# Patient Record
Sex: Male | Born: 1950 | Race: White | Hispanic: No | Marital: Married | Smoking: Never smoker
Health system: Southern US, Community
[De-identification: ages and names within clinical notes are randomized; demographics above are authoritative.]

## PROBLEM LIST (undated history)

## (undated) DIAGNOSIS — I2510 Atherosclerotic heart disease of native coronary artery without angina pectoris: Secondary | ICD-10-CM

## (undated) DIAGNOSIS — I219 Acute myocardial infarction, unspecified: Secondary | ICD-10-CM

## (undated) DIAGNOSIS — N289 Disorder of kidney and ureter, unspecified: Secondary | ICD-10-CM

## (undated) DIAGNOSIS — I509 Heart failure, unspecified: Secondary | ICD-10-CM

## (undated) DIAGNOSIS — I639 Cerebral infarction, unspecified: Secondary | ICD-10-CM

## (undated) DIAGNOSIS — E119 Type 2 diabetes mellitus without complications: Secondary | ICD-10-CM

## (undated) DIAGNOSIS — J8410 Pulmonary fibrosis, unspecified: Secondary | ICD-10-CM

## (undated) DIAGNOSIS — I10 Essential (primary) hypertension: Secondary | ICD-10-CM

## (undated) DIAGNOSIS — M1990 Unspecified osteoarthritis, unspecified site: Secondary | ICD-10-CM

## (undated) DIAGNOSIS — R911 Solitary pulmonary nodule: Secondary | ICD-10-CM

## (undated) HISTORY — PX: EP IMPLANTABLE DEVICE: SHX172B

## (undated) HISTORY — PX: BACK SURGERY: SHX140

## (undated) HISTORY — PX: CARDIAC SURGERY: SHX584

## (undated) HISTORY — PX: OTHER SURGICAL HISTORY: SHX169

## (undated) HISTORY — PX: SHOULDER ARTHROSCOPY: SHX128

## (undated) HISTORY — PX: CORONARY ARTERY BYPASS GRAFT: SHX141

---

## 2011-11-26 DIAGNOSIS — C4490 Unspecified malignant neoplasm of skin, unspecified: Secondary | ICD-10-CM | POA: Insufficient documentation

## 2012-06-15 DIAGNOSIS — I272 Pulmonary hypertension, unspecified: Secondary | ICD-10-CM | POA: Diagnosis present

## 2012-07-22 DIAGNOSIS — E119 Type 2 diabetes mellitus without complications: Secondary | ICD-10-CM

## 2012-07-22 DIAGNOSIS — Z951 Presence of aortocoronary bypass graft: Secondary | ICD-10-CM | POA: Insufficient documentation

## 2012-07-22 DIAGNOSIS — J8410 Pulmonary fibrosis, unspecified: Secondary | ICD-10-CM | POA: Diagnosis present

## 2013-01-20 DIAGNOSIS — I252 Old myocardial infarction: Secondary | ICD-10-CM | POA: Insufficient documentation

## 2013-03-14 DIAGNOSIS — I255 Ischemic cardiomyopathy: Secondary | ICD-10-CM | POA: Diagnosis present

## 2013-03-22 DIAGNOSIS — J9690 Respiratory failure, unspecified, unspecified whether with hypoxia or hypercapnia: Secondary | ICD-10-CM | POA: Insufficient documentation

## 2013-04-30 DIAGNOSIS — R52 Pain, unspecified: Secondary | ICD-10-CM | POA: Insufficient documentation

## 2013-04-30 DIAGNOSIS — N138 Other obstructive and reflux uropathy: Secondary | ICD-10-CM | POA: Insufficient documentation

## 2013-04-30 DIAGNOSIS — N401 Enlarged prostate with lower urinary tract symptoms: Secondary | ICD-10-CM | POA: Insufficient documentation

## 2013-04-30 DIAGNOSIS — I2510 Atherosclerotic heart disease of native coronary artery without angina pectoris: Secondary | ICD-10-CM | POA: Diagnosis present

## 2013-05-03 DIAGNOSIS — R918 Other nonspecific abnormal finding of lung field: Secondary | ICD-10-CM | POA: Insufficient documentation

## 2013-07-24 DIAGNOSIS — I509 Heart failure, unspecified: Secondary | ICD-10-CM

## 2013-08-18 DIAGNOSIS — G4733 Obstructive sleep apnea (adult) (pediatric): Secondary | ICD-10-CM | POA: Insufficient documentation

## 2013-09-17 ENCOUNTER — Emergency Department: Payer: Self-pay | Admitting: Emergency Medicine

## 2013-09-17 LAB — COMPREHENSIVE METABOLIC PANEL WITH GFR
Albumin: 3.5 g/dL
Alkaline Phosphatase: 116 U/L
Anion Gap: 8
BUN: 46 mg/dL — ABNORMAL HIGH
Bilirubin,Total: 0.3 mg/dL
Calcium, Total: 8.4 mg/dL — ABNORMAL LOW
Chloride: 106 mmol/L
Co2: 27 mmol/L
Creatinine: 2.27 mg/dL — ABNORMAL HIGH
EGFR (African American): 34 — ABNORMAL LOW
EGFR (Non-African Amer.): 30 — ABNORMAL LOW
Glucose: 142 mg/dL — ABNORMAL HIGH
Osmolality: 296
Potassium: 3.5 mmol/L
SGOT(AST): 45 U/L — ABNORMAL HIGH
SGPT (ALT): 48 U/L
Sodium: 141 mmol/L
Total Protein: 7 g/dL

## 2013-09-17 LAB — URINALYSIS, COMPLETE
BACTERIA: NONE SEEN
BILIRUBIN, UR: NEGATIVE
Blood: NEGATIVE
GLUCOSE, UR: NEGATIVE mg/dL (ref 0–75)
Ketone: NEGATIVE
Leukocyte Esterase: NEGATIVE
Nitrite: NEGATIVE
PH: 5 (ref 4.5–8.0)
Protein: NEGATIVE
Specific Gravity: 1.008 (ref 1.003–1.030)
Squamous Epithelial: 1
WBC UR: <1 /HPF (ref 0–5)

## 2013-09-17 LAB — CBC
HCT: 34.3 % — AB (ref 40.0–52.0)
HGB: 11.2 g/dL — ABNORMAL LOW (ref 13.0–18.0)
MCH: 31.8 pg (ref 26.0–34.0)
MCHC: 32.7 g/dL (ref 32.0–36.0)
MCV: 97 fL (ref 80–100)
Platelet: 215 10*3/uL (ref 150–440)
RBC: 3.52 10*6/uL — AB (ref 4.40–5.90)
RDW: 15.1 % — ABNORMAL HIGH (ref 11.5–14.5)
WBC: 7.3 10*3/uL (ref 3.8–10.6)

## 2013-09-17 LAB — TROPONIN I

## 2013-10-02 ENCOUNTER — Other Ambulatory Visit: Payer: Self-pay | Admitting: Cardiology

## 2013-10-02 LAB — CBC WITH DIFFERENTIAL/PLATELET
Basophil #: 0.1 10*3/uL (ref 0.0–0.1)
Basophil %: 1.2 %
EOS ABS: 0.7 10*3/uL (ref 0.0–0.7)
Eosinophil %: 7.5 %
HCT: 35.8 % — AB (ref 40.0–52.0)
HGB: 11.6 g/dL — ABNORMAL LOW (ref 13.0–18.0)
Lymphocyte #: 1.4 10*3/uL (ref 1.0–3.6)
Lymphocyte %: 15.7 %
MCH: 31.7 pg (ref 26.0–34.0)
MCHC: 32.3 g/dL (ref 32.0–36.0)
MCV: 98 fL (ref 80–100)
Monocyte #: 0.9 x10 3/mm (ref 0.2–1.0)
Monocyte %: 10.5 %
Neutrophil #: 5.8 10*3/uL (ref 1.4–6.5)
Neutrophil %: 65.1 %
Platelet: 219 10*3/uL (ref 150–440)
RBC: 3.64 10*6/uL — ABNORMAL LOW (ref 4.40–5.90)
RDW: 15.3 % — ABNORMAL HIGH (ref 11.5–14.5)
WBC: 8.8 10*3/uL (ref 3.8–10.6)

## 2013-10-02 LAB — BASIC METABOLIC PANEL
ANION GAP: 6 — AB (ref 7–16)
BUN: 50 mg/dL — ABNORMAL HIGH (ref 7–18)
Calcium, Total: 9.1 mg/dL (ref 8.5–10.1)
Chloride: 101 mmol/L (ref 98–107)
Co2: 30 mmol/L (ref 21–32)
Creatinine: 2.57 mg/dL — ABNORMAL HIGH (ref 0.60–1.30)
EGFR (Non-African Amer.): 25 — ABNORMAL LOW
GFR CALC AF AMER: 30 — AB
GLUCOSE: 167 mg/dL — AB (ref 65–99)
Osmolality: 291 (ref 275–301)
POTASSIUM: 4 mmol/L (ref 3.5–5.1)
Sodium: 137 mmol/L (ref 136–145)

## 2013-10-11 DIAGNOSIS — Z95810 Presence of automatic (implantable) cardiac defibrillator: Secondary | ICD-10-CM | POA: Diagnosis present

## 2013-11-02 DIAGNOSIS — I495 Sick sinus syndrome: Secondary | ICD-10-CM | POA: Insufficient documentation

## 2013-11-12 HISTORY — PX: CAROTID STENT: SHX1301

## 2013-11-20 DIAGNOSIS — I10 Essential (primary) hypertension: Secondary | ICD-10-CM | POA: Insufficient documentation

## 2013-11-22 ENCOUNTER — Inpatient Hospital Stay (HOSPITAL_COMMUNITY): Payer: BC Managed Care – PPO

## 2013-11-22 ENCOUNTER — Encounter (HOSPITAL_COMMUNITY): Payer: Self-pay | Admitting: Emergency Medicine

## 2013-11-22 ENCOUNTER — Emergency Department (HOSPITAL_COMMUNITY): Payer: BC Managed Care – PPO

## 2013-11-22 ENCOUNTER — Inpatient Hospital Stay (HOSPITAL_COMMUNITY)
Admission: EM | Admit: 2013-11-22 | Discharge: 2013-11-23 | DRG: 069 | Disposition: A | Discharge status: Other Acute Inpatient Hospital | Payer: BC Managed Care – PPO | Attending: Family Medicine | Admitting: Family Medicine

## 2013-11-22 DIAGNOSIS — N390 Urinary tract infection, site not specified: Secondary | ICD-10-CM | POA: Diagnosis present

## 2013-11-22 DIAGNOSIS — I959 Hypotension, unspecified: Secondary | ICD-10-CM | POA: Diagnosis present

## 2013-11-22 DIAGNOSIS — I639 Cerebral infarction, unspecified: Secondary | ICD-10-CM | POA: Diagnosis present

## 2013-11-22 DIAGNOSIS — Z8673 Personal history of transient ischemic attack (TIA), and cerebral infarction without residual deficits: Secondary | ICD-10-CM | POA: Diagnosis not present

## 2013-11-22 DIAGNOSIS — Z79899 Other long term (current) drug therapy: Secondary | ICD-10-CM | POA: Diagnosis not present

## 2013-11-22 DIAGNOSIS — N189 Chronic kidney disease, unspecified: Secondary | ICD-10-CM | POA: Diagnosis present

## 2013-11-22 DIAGNOSIS — M109 Gout, unspecified: Secondary | ICD-10-CM | POA: Insufficient documentation

## 2013-11-22 DIAGNOSIS — Z7902 Long term (current) use of antithrombotics/antiplatelets: Secondary | ICD-10-CM | POA: Diagnosis not present

## 2013-11-22 DIAGNOSIS — J8410 Pulmonary fibrosis, unspecified: Secondary | ICD-10-CM

## 2013-11-22 DIAGNOSIS — R531 Weakness: Secondary | ICD-10-CM | POA: Diagnosis not present

## 2013-11-22 DIAGNOSIS — G451 Carotid artery syndrome (hemispheric): Secondary | ICD-10-CM

## 2013-11-22 DIAGNOSIS — I2510 Atherosclerotic heart disease of native coronary artery without angina pectoris: Secondary | ICD-10-CM | POA: Diagnosis present

## 2013-11-22 DIAGNOSIS — G459 Transient cerebral ischemic attack, unspecified: Principal | ICD-10-CM | POA: Diagnosis present

## 2013-11-22 DIAGNOSIS — I255 Ischemic cardiomyopathy: Secondary | ICD-10-CM | POA: Diagnosis present

## 2013-11-22 DIAGNOSIS — N400 Enlarged prostate without lower urinary tract symptoms: Secondary | ICD-10-CM | POA: Diagnosis present

## 2013-11-22 DIAGNOSIS — I129 Hypertensive chronic kidney disease with stage 1 through stage 4 chronic kidney disease, or unspecified chronic kidney disease: Secondary | ICD-10-CM | POA: Diagnosis present

## 2013-11-22 DIAGNOSIS — E119 Type 2 diabetes mellitus without complications: Secondary | ICD-10-CM | POA: Diagnosis present

## 2013-11-22 DIAGNOSIS — I252 Old myocardial infarction: Secondary | ICD-10-CM | POA: Diagnosis not present

## 2013-11-22 DIAGNOSIS — Z951 Presence of aortocoronary bypass graft: Secondary | ICD-10-CM

## 2013-11-22 DIAGNOSIS — I509 Heart failure, unspecified: Secondary | ICD-10-CM | POA: Diagnosis present

## 2013-11-22 DIAGNOSIS — N183 Chronic kidney disease, stage 3 (moderate): Secondary | ICD-10-CM

## 2013-11-22 DIAGNOSIS — E669 Obesity, unspecified: Secondary | ICD-10-CM | POA: Diagnosis present

## 2013-11-22 DIAGNOSIS — I5042 Chronic combined systolic (congestive) and diastolic (congestive) heart failure: Secondary | ICD-10-CM

## 2013-11-22 DIAGNOSIS — E11319 Type 2 diabetes mellitus with unspecified diabetic retinopathy without macular edema: Secondary | ICD-10-CM | POA: Insufficient documentation

## 2013-11-22 DIAGNOSIS — Z6828 Body mass index (BMI) 28.0-28.9, adult: Secondary | ICD-10-CM

## 2013-11-22 DIAGNOSIS — Z95810 Presence of automatic (implantable) cardiac defibrillator: Secondary | ICD-10-CM

## 2013-11-22 DIAGNOSIS — M1990 Unspecified osteoarthritis, unspecified site: Secondary | ICD-10-CM | POA: Diagnosis present

## 2013-11-22 DIAGNOSIS — G629 Polyneuropathy, unspecified: Secondary | ICD-10-CM | POA: Diagnosis present

## 2013-11-22 DIAGNOSIS — G4733 Obstructive sleep apnea (adult) (pediatric): Secondary | ICD-10-CM | POA: Diagnosis present

## 2013-11-22 DIAGNOSIS — E785 Hyperlipidemia, unspecified: Secondary | ICD-10-CM | POA: Diagnosis present

## 2013-11-22 DIAGNOSIS — Z955 Presence of coronary angioplasty implant and graft: Secondary | ICD-10-CM | POA: Diagnosis not present

## 2013-11-22 DIAGNOSIS — Z794 Long term (current) use of insulin: Secondary | ICD-10-CM

## 2013-11-22 DIAGNOSIS — I6529 Occlusion and stenosis of unspecified carotid artery: Secondary | ICD-10-CM | POA: Insufficient documentation

## 2013-11-22 DIAGNOSIS — I270 Primary pulmonary hypertension: Secondary | ICD-10-CM

## 2013-11-22 DIAGNOSIS — E1140 Type 2 diabetes mellitus with diabetic neuropathy, unspecified: Secondary | ICD-10-CM | POA: Insufficient documentation

## 2013-11-22 DIAGNOSIS — Z7982 Long term (current) use of aspirin: Secondary | ICD-10-CM

## 2013-11-22 DIAGNOSIS — N320 Bladder-neck obstruction: Secondary | ICD-10-CM | POA: Insufficient documentation

## 2013-11-22 DIAGNOSIS — I272 Pulmonary hypertension, unspecified: Secondary | ICD-10-CM | POA: Diagnosis present

## 2013-11-22 HISTORY — DX: Pulmonary fibrosis, unspecified: J84.10

## 2013-11-22 HISTORY — DX: Heart failure, unspecified: I50.9

## 2013-11-22 HISTORY — DX: Solitary pulmonary nodule: R91.1

## 2013-11-22 HISTORY — DX: Cerebral infarction, unspecified: I63.9

## 2013-11-22 HISTORY — DX: Disorder of kidney and ureter, unspecified: N28.9

## 2013-11-22 HISTORY — DX: Type 2 diabetes mellitus without complications: E11.9

## 2013-11-22 HISTORY — DX: Essential (primary) hypertension: I10

## 2013-11-22 HISTORY — DX: Atherosclerotic heart disease of native coronary artery without angina pectoris: I25.10

## 2013-11-22 HISTORY — DX: Unspecified osteoarthritis, unspecified site: M19.90

## 2013-11-22 LAB — COMPREHENSIVE METABOLIC PANEL
ALK PHOS: 155 U/L — AB (ref 39–117)
ALT: 29 U/L (ref 0–53)
ANION GAP: 14 (ref 5–15)
AST: 24 U/L (ref 0–37)
Albumin: 3.8 g/dL (ref 3.5–5.2)
BILIRUBIN TOTAL: 0.3 mg/dL (ref 0.3–1.2)
BUN: 29 mg/dL — AB (ref 6–23)
CHLORIDE: 101 meq/L (ref 96–112)
CO2: 25 meq/L (ref 19–32)
Calcium: 9.4 mg/dL (ref 8.4–10.5)
Creatinine, Ser: 1.7 mg/dL — ABNORMAL HIGH (ref 0.50–1.35)
GFR, EST AFRICAN AMERICAN: 48 mL/min — AB (ref >90)
GFR, EST NON AFRICAN AMERICAN: 41 mL/min — AB (ref >90)
GLUCOSE: 100 mg/dL — AB (ref 70–99)
POTASSIUM: 4.3 meq/L (ref 3.7–5.3)
SODIUM: 140 meq/L (ref 137–147)
Total Protein: 6.9 g/dL (ref 6.0–8.3)

## 2013-11-22 LAB — I-STAT TROPONIN, ED: Troponin i, poc: 0.03 ng/mL (ref 0.00–0.08)

## 2013-11-22 LAB — GLUCOSE, CAPILLARY
GLUCOSE-CAPILLARY: 228 mg/dL — AB (ref 70–99)
Glucose-Capillary: 190 mg/dL — ABNORMAL HIGH (ref 70–99)

## 2013-11-22 LAB — DIFFERENTIAL
Basophils Absolute: 0.1 10*3/uL (ref 0.0–0.1)
Basophils Relative: 1 % (ref 0–1)
Eosinophils Absolute: 0.5 10*3/uL (ref 0.0–0.7)
Eosinophils Relative: 7 % — ABNORMAL HIGH (ref 0–5)
LYMPHS ABS: 1.5 10*3/uL (ref 0.7–4.0)
LYMPHS PCT: 20 % (ref 12–46)
Monocytes Absolute: 1.1 10*3/uL — ABNORMAL HIGH (ref 0.1–1.0)
Monocytes Relative: 14 % — ABNORMAL HIGH (ref 3–12)
NEUTROS PCT: 58 % (ref 43–77)
Neutro Abs: 4.3 10*3/uL (ref 1.7–7.7)

## 2013-11-22 LAB — I-STAT CHEM 8, ED
BUN: 33 mg/dL — AB (ref 6–23)
CALCIUM ION: 1.18 mmol/L (ref 1.13–1.30)
CHLORIDE: 104 meq/L (ref 96–112)
CREATININE: 1.8 mg/dL — AB (ref 0.50–1.35)
GLUCOSE: 97 mg/dL (ref 70–99)
HCT: 37 % — ABNORMAL LOW (ref 39.0–52.0)
Hemoglobin: 12.6 g/dL — ABNORMAL LOW (ref 13.0–17.0)
Potassium: 4.1 mEq/L (ref 3.7–5.3)
Sodium: 141 mEq/L (ref 137–147)
TCO2: 24 mmol/L (ref 0–100)

## 2013-11-22 LAB — CBC
HCT: 36.6 % — ABNORMAL LOW (ref 39.0–52.0)
Hemoglobin: 12.2 g/dL — ABNORMAL LOW (ref 13.0–17.0)
MCH: 32.1 pg (ref 26.0–34.0)
MCHC: 33.3 g/dL (ref 30.0–36.0)
MCV: 96.3 fL (ref 78.0–100.0)
PLATELETS: 216 10*3/uL (ref 150–400)
RBC: 3.8 MIL/uL — AB (ref 4.22–5.81)
RDW: 13.9 % (ref 11.5–15.5)
WBC: 7.4 10*3/uL (ref 4.0–10.5)

## 2013-11-22 LAB — CBG MONITORING, ED: Glucose-Capillary: 82 mg/dL (ref 70–99)

## 2013-11-22 LAB — PROTIME-INR
INR: 1.03 (ref 0.00–1.49)
Prothrombin Time: 13.6 seconds (ref 11.6–15.2)

## 2013-11-22 LAB — APTT: aPTT: 31 seconds (ref 24–37)

## 2013-11-22 MED ORDER — DIPHENHYDRAMINE HCL 25 MG PO CAPS: 25.0000 mg | ORAL_CAPSULE | Freq: Four times a day (QID) | ORAL | Status: DC | PRN

## 2013-11-22 MED ORDER — GABAPENTIN 300 MG PO CAPS
300.0000 mg | ORAL_CAPSULE | Freq: Two times a day (BID) | ORAL | Status: DC
  Administered 2013-11-22 – 2013-11-23 (×3): 300 mg via ORAL
  Filled 2013-11-22 (×3): qty 1

## 2013-11-22 MED ORDER — DIPHENHYDRAMINE HCL 50 MG/ML IJ SOLN
12.5000 mg | Freq: Once | INTRAMUSCULAR | Status: AC
  Administered 2013-11-22: 12.5 mg via INTRAVENOUS
  Filled 2013-11-22: qty 1

## 2013-11-22 MED ORDER — NITROGLYCERIN 0.4 MG SL SUBL
0.4000 mg | SUBLINGUAL_TABLET | SUBLINGUAL | Status: DC | PRN
Start: 2013-11-22 — End: 2013-11-24

## 2013-11-22 MED ORDER — ALLOPURINOL 100 MG PO TABS
150.0000 mg | ORAL_TABLET | Freq: Every day | ORAL | Status: DC
  Administered 2013-11-23: 150 mg via ORAL
  Filled 2013-11-22: qty 2

## 2013-11-22 MED ORDER — METOPROLOL SUCCINATE ER 25 MG PO TB24
25.0000 mg | ORAL_TABLET | Freq: Every day | ORAL | Status: DC
  Administered 2013-11-23: 25 mg via ORAL
  Filled 2013-11-22: qty 1

## 2013-11-22 MED ORDER — DEXAMETHASONE SODIUM PHOSPHATE 10 MG/ML IJ SOLN
10.0000 mg | Freq: Once | INTRAMUSCULAR | Status: AC
  Administered 2013-11-22: 10 mg via INTRAVENOUS
  Filled 2013-11-22: qty 1

## 2013-11-22 MED ORDER — HYDROCODONE-ACETAMINOPHEN 5-325 MG PO TABS: 1.0000 | ORAL_TABLET | Freq: Four times a day (QID) | ORAL | Status: DC | PRN

## 2013-11-22 MED ORDER — SODIUM CHLORIDE 0.9 % IJ SOLN
3.0000 mL | Freq: Two times a day (BID) | INTRAMUSCULAR | Status: DC
  Administered 2013-11-22 – 2013-11-23 (×4): 3 mL via INTRAVENOUS

## 2013-11-22 MED ORDER — COLCHICINE 0.6 MG PO TABS
0.6000 mg | ORAL_TABLET | Freq: Every day | ORAL | Status: DC
  Administered 2013-11-23: 0.6 mg via ORAL
  Filled 2013-11-22: qty 1

## 2013-11-22 MED ORDER — PANTOPRAZOLE SODIUM 40 MG PO TBEC
40.0000 mg | DELAYED_RELEASE_TABLET | Freq: Every day | ORAL | Status: DC
  Administered 2013-11-23: 40 mg via ORAL
  Filled 2013-11-22: qty 1

## 2013-11-22 MED ORDER — BUMETANIDE 2 MG PO TABS
2.0000 mg | ORAL_TABLET | Freq: Every day | ORAL | Status: DC
  Administered 2013-11-23: 2 mg via ORAL
  Filled 2013-11-22: qty 1

## 2013-11-22 MED ORDER — ACETAMINOPHEN 650 MG RE SUPP: 650.0000 mg | Freq: Four times a day (QID) | RECTAL | Status: DC | PRN

## 2013-11-22 MED ORDER — FLUTICASONE PROPIONATE 50 MCG/ACT NA SUSP
1.0000 | Freq: Every day | NASAL | Status: DC | PRN
  Filled 2013-11-22: qty 16

## 2013-11-22 MED ORDER — ASPIRIN EC 81 MG PO TBEC
81.0000 mg | DELAYED_RELEASE_TABLET | Freq: Every day | ORAL | Status: DC
  Administered 2013-11-23: 81 mg via ORAL
  Filled 2013-11-22: qty 1

## 2013-11-22 MED ORDER — METOCLOPRAMIDE HCL 5 MG/ML IJ SOLN
10.0000 mg | Freq: Once | INTRAMUSCULAR | Status: AC
  Administered 2013-11-22: 10 mg via INTRAVENOUS
  Filled 2013-11-22: qty 2

## 2013-11-22 MED ORDER — ESCITALOPRAM OXALATE 10 MG PO TABS
20.0000 mg | ORAL_TABLET | Freq: Every day | ORAL | Status: DC
  Administered 2013-11-23: 20 mg via ORAL
  Filled 2013-11-22: qty 2

## 2013-11-22 MED ORDER — INSULIN ASPART 100 UNIT/ML ~~LOC~~ SOLN
0.0000 [IU] | Freq: Three times a day (TID) | SUBCUTANEOUS | Status: DC
  Administered 2013-11-22: 3 [IU] via SUBCUTANEOUS
  Administered 2013-11-23: 8 [IU] via SUBCUTANEOUS
  Administered 2013-11-23: 11 [IU] via SUBCUTANEOUS
  Administered 2013-11-23: 5 [IU] via SUBCUTANEOUS

## 2013-11-22 MED ORDER — CEPHALEXIN 500 MG PO CAPS
500.0000 mg | ORAL_CAPSULE | Freq: Three times a day (TID) | ORAL | Status: DC
  Administered 2013-11-22 – 2013-11-23 (×4): 500 mg via ORAL
  Filled 2013-11-22 (×4): qty 1

## 2013-11-22 MED ORDER — INSULIN ASPART 100 UNIT/ML ~~LOC~~ SOLN
0.0000 [IU] | Freq: Every day | SUBCUTANEOUS | Status: DC
  Administered 2013-11-22: 2 [IU] via SUBCUTANEOUS
  Administered 2013-11-23: 4 [IU] via SUBCUTANEOUS

## 2013-11-22 MED ORDER — DUTASTERIDE 0.5 MG PO CAPS
0.5000 mg | ORAL_CAPSULE | Freq: Every day | ORAL | Status: DC
  Administered 2013-11-23: 0.5 mg via ORAL
  Filled 2013-11-22: qty 1

## 2013-11-22 MED ORDER — MORPHINE SULFATE 2 MG/ML IJ SOLN: 2.0000 mg | INTRAMUSCULAR | Status: DC | PRN

## 2013-11-22 MED ORDER — HEPARIN SODIUM (PORCINE) 5000 UNIT/ML IJ SOLN
5000.0000 [IU] | Freq: Three times a day (TID) | INTRAMUSCULAR | Status: DC
  Administered 2013-11-22 – 2013-11-23 (×5): 5000 [IU] via SUBCUTANEOUS
  Filled 2013-11-22 (×5): qty 1

## 2013-11-22 MED ORDER — BISACODYL 5 MG PO TBEC: 5.0000 mg | DELAYED_RELEASE_TABLET | Freq: Every day | ORAL | Status: DC | PRN

## 2013-11-22 MED ORDER — SODIUM CHLORIDE 0.9 % IV BOLUS (SEPSIS)
1000.0000 mL | Freq: Once | INTRAVENOUS | Status: AC
  Administered 2013-11-22: 1000 mL via INTRAVENOUS

## 2013-11-22 MED ORDER — TRAMADOL HCL 50 MG PO TABS
50.0000 mg | ORAL_TABLET | Freq: Two times a day (BID) | ORAL | Status: DC | PRN
  Administered 2013-11-22 – 2013-11-23 (×3): 50 mg via ORAL
  Filled 2013-11-22 (×3): qty 1

## 2013-11-22 MED ORDER — ACETAMINOPHEN 325 MG PO TABS: 650.0000 mg | ORAL_TABLET | Freq: Four times a day (QID) | ORAL | Status: DC | PRN

## 2013-11-22 MED ORDER — CLOPIDOGREL BISULFATE 75 MG PO TABS
75.0000 mg | ORAL_TABLET | Freq: Every day | ORAL | Status: DC
  Administered 2013-11-23: 75 mg via ORAL
  Filled 2013-11-22: qty 1

## 2013-11-22 MED ORDER — INSULIN NPH (HUMAN) (ISOPHANE) 100 UNIT/ML ~~LOC~~ SUSP
15.0000 [IU] | Freq: Two times a day (BID) | SUBCUTANEOUS | Status: DC
  Administered 2013-11-23 (×2): 15 [IU] via SUBCUTANEOUS
  Filled 2013-11-22 (×2): qty 10

## 2013-11-22 NOTE — ED Notes (Signed)
Left Leg numbness reported to Stroke RN and PA

## 2013-11-22 NOTE — ED Provider Notes (Signed)
CSN: [id number]     Arrival date & time 11/22/13  1159 History   First MD Initiated Contact with Patient 11/22/13 1203     No chief complaint on file.    (Consider location/radiation/quality/duration/timing/severity/associated sxs/prior Treatment) Patient is a 63 y.o. male presenting with neurologic complaint. The history is provided by the patient.  Neurologic Problem This is a new problem. The current episode started less than 1 hour ago. The problem occurs constantly. The problem has been resolved. Associated symptoms include headaches. Pertinent negatives include no chest pain and no shortness of breath. Nothing aggravates the symptoms. Nothing relieves the symptoms. He has tried nothing for the symptoms.    No past medical history on file. No past surgical history on file. No family history on file. History  Substance Use Topics  . Smoking status: Not on file  . Smokeless tobacco: Not on file  . Alcohol Use: Not on file    Review of Systems  Constitutional: Negative for fever and chills.  Respiratory: Negative for cough and shortness of breath.   Cardiovascular: Negative for chest pain.  Neurological: Positive for speech difficulty (prior to arrival) and headaches.  All other systems reviewed and are negative.     Allergies  Review of patient's allergies indicates not on file.  Home Medications   Prior to Admission medications   Not on File   There were no vitals taken for this visit. Physical Exam  Constitutional: He is oriented to person, place, and time. He appears well-developed and well-nourished. No distress.  HENT:  Head: Normocephalic and atraumatic.  Mouth/Throat: No oropharyngeal exudate.  Eyes: EOM are normal. Pupils are equal, round, and reactive to light.  Neck: Normal range of motion. Neck supple.  Cardiovascular: Normal rate and regular rhythm.  Exam reveals no friction rub.   No murmur heard. Pulmonary/Chest: Effort normal and breath sounds  normal. No respiratory distress. He has no wheezes. He has no rales.  Abdominal: He exhibits no distension. There is no tenderness. There is no rebound.  Musculoskeletal: Normal range of motion. He exhibits no edema.  Neurological: He is alert and oriented to person, place, and time. No cranial nerve deficit. He exhibits normal muscle tone. Coordination normal.  Skin: No rash noted. He is not diaphoretic.  Nursing note and vitals reviewed.   ED Course  Procedures (including critical care time) Labs Review Labs Reviewed  PROTIME-INR  APTT  CBC  DIFFERENTIAL  COMPREHENSIVE METABOLIC PANEL  CBG MONITORING, ED  I-STAT TROPOININ, ED    Imaging Review Ct Angio Head W/cm &/or Wo Cm  11/23/2013   CLINICAL DATA:  Initial evaluation for headache. History of carotid stenosis.  EXAM: CT ANGIOGRAPHY HEAD AND NECK  TECHNIQUE: Multidetector CT imaging of the head and neck was performed using the standard protocol during bolus administration of intravenous contrast. Multiplanar CT image reconstructions and MIPs were obtained to evaluate the vascular anatomy. Carotid stenosis measurements (when applicable) are obtained utilizing NASCET criteria, using the distal internal carotid diameter as the denominator.  CONTRAST:  50 cc of Omnipaque 350.  COMPARISON:  Prior noncontrast head CT from earlier the same day.  FINDINGS: CTA HEAD FINDINGS  ANTERIOR CIRCULATION:  The petrous segments are well opacified bilaterally. There is scattered calcified atheromatous plaque within the cavernous segments of the internal carotid arteries bilaterally with approximately 40% of stenosis by NASCET criteria. Supra clinoid segments well opacified bilaterally. The right A1 segment is hypoplastic. Left A1 segment well opacified. Anterior communicating artery and anterior  cerebral arteries well opacified bilaterally.  M1 segments well opacified without proximal branch occlusion or hemodynamically significant stenosis. MCA  bifurcations within normal limits. Distal MCA branches well opacified bilaterally.  POSTERIOR CIRCULATION:  Right vertebral artery is dominant. Vertebral arteries are well opacified without significant stenosis or occlusion. Posterior inferior cerebral arteries well opacified bilaterally. Vertebrobasilar junction and basilar artery within normal limits. Mild multi focal atherosclerotic irregularity present within the posterior cerebral arteries bilaterally without significant stenosis. Superior cerebral arteries well opacified bilaterally. Small right posterior communicating artery present.  No aneurysm or vascular malformation within the intracranial circulation.  No abnormal enhancement on delayed imaging.  Review of the MIP images confirms the above findings.  CTA NECK FINDINGS  Visualized aortic arch is of normal caliber with normal 3 vessel morphology. Mild atheromatous plaque present within the aortic arch. No high-grade stenosis seen at the origin of the great vessels.  Subclavian artery is well opacified bilaterally.  The common carotid arteries are widely patent without hemodynamically significant stenosis, dissection, or occlusion.  On the right, there is prominent calcified and noncalcified plaque about the carotid bifurcation and proximal right internal carotid artery. There is associated severe stenosis of approximately 80-90% by NASCET criteria (series 401, image 64). This stenosis extends from the carotid bifurcation and measures approximately 13 mm in length. Distally, the right internal carotid artery is well opacified to the level of the skullbase without significant stenosis, dissection, or occlusion.  On the left, endovascular stent is in place within the proximal left internal carotid artery. There is intraluminal stenosis within the stent of approximately 40-50% by NASCET criteria. Distally, the left internal carotid artery is well opacified to the level of the skullbase without evidence of  dissection or occlusion.  The external carotid arteries and their branches are within normal limits.  The vertebral arteries arise from the subclavian arteries bilaterally. Vertebral arteries are well opacified along their entire course without evidence of dissection, occlusion, or hemodynamically significant stenosis.  Review of the MIP images confirms the above findings.  No acute soft tissue abnormality within the neck. No adenopathy. Thyroid gland is normal. Visualized superior mediastinum within normal limits.  There is irregular patchy opacity within the partially visualized right lung apex, which may in part reflect atelectasis and/or scar. Possible infection could also be considered in the correct clinical setting. Left lung apex is clear.  Reversal of the normal cervical lordosis with apex at C5-6 present. Patient appears to be status post prior fusion at the C5-C6 level. No worrisome lytic or blastic osseous lesions.  IMPRESSION: CTA NECK:  1. Calcified and noncalcified plaque at the origin of the right internal carotid artery with associated stenosis of 80-90% by NASCET criteria. This stenosis extends from the carotid bifurcation and measures approximately 13 mm in length. 2. Status post stenting of the proximal left internal carotid artery. There is patent but narrowed flow within the vascular stent itself, measuring approximately 40-50% stenosis by NASCET criteria. 3. No other hemodynamically significant stenosis, dissection, or occlusion identified within the major arterial vasculature of the neck. 4. Patchy ill-defined opacity within the partially visualized right lung apex. While this finding may reflect underlying atelectasis/scarring, possible infectious infiltrate could be considered in the correct clinical setting. Correlation with plain film radiography could be performed for further evaluation.  CTA HEAD:  1. No proximal branch occlusion or hemodynamically significant stenosis identified within  the intracranial circulation. 2. Calcified atheromatous disease within the cavernous segments of the internal carotid arteries bilaterally with associated  stenosis of up to 40-50% by NASCET criteria.   Electronically Signed   By: Jeannine Boga M.D.   On: 11/23/2013 02:18   Ct Head (brain) Wo Contrast  11/22/2013   CLINICAL DATA:  Acute onset left facial droop and aphasia  EXAM: CT HEAD WITHOUT CONTRAST  TECHNIQUE: Contiguous axial images were obtained from the base of the skull through the vertex without intravenous contrast.  COMPARISON:  None.  FINDINGS: The ventricles are normal in size and configuration. There is no appreciable mass, hemorrhage, extra-axial fluid collection, or midline shift. No focal gray-white compartment lesions are appreciable on this study. No acute infarct apparent. Bony calvarium appears intact. The mastoid air cells are clear.  IMPRESSION: No intracranial mass, hemorrhage, or focal gray -white compartment lesions/acute appearing infarct.  Critical Value/emergent results were called by telephone at the time of interpretation on 11/22/2013 at 12:35 pm to Dr. Doy Mince, neurology, who verbally acknowledged these results.   Electronically Signed   By: Lowella Grip M.D.   On: 11/22/2013 12:35   Ct Angio Neck W/cm &/or Wo/cm  11/23/2013   CLINICAL DATA:  Initial evaluation for headache. History of carotid stenosis.  EXAM: CT ANGIOGRAPHY HEAD AND NECK  TECHNIQUE: Multidetector CT imaging of the head and neck was performed using the standard protocol during bolus administration of intravenous contrast. Multiplanar CT image reconstructions and MIPs were obtained to evaluate the vascular anatomy. Carotid stenosis measurements (when applicable) are obtained utilizing NASCET criteria, using the distal internal carotid diameter as the denominator.  CONTRAST:  50 cc of Omnipaque 350.  COMPARISON:  Prior noncontrast head CT from earlier the same day.  FINDINGS: CTA HEAD FINDINGS   ANTERIOR CIRCULATION:  The petrous segments are well opacified bilaterally. There is scattered calcified atheromatous plaque within the cavernous segments of the internal carotid arteries bilaterally with approximately 40% of stenosis by NASCET criteria. Supra clinoid segments well opacified bilaterally. The right A1 segment is hypoplastic. Left A1 segment well opacified. Anterior communicating artery and anterior cerebral arteries well opacified bilaterally.  M1 segments well opacified without proximal branch occlusion or hemodynamically significant stenosis. MCA bifurcations within normal limits. Distal MCA branches well opacified bilaterally.  POSTERIOR CIRCULATION:  Right vertebral artery is dominant. Vertebral arteries are well opacified without significant stenosis or occlusion. Posterior inferior cerebral arteries well opacified bilaterally. Vertebrobasilar junction and basilar artery within normal limits. Mild multi focal atherosclerotic irregularity present within the posterior cerebral arteries bilaterally without significant stenosis. Superior cerebral arteries well opacified bilaterally. Small right posterior communicating artery present.  No aneurysm or vascular malformation within the intracranial circulation.  No abnormal enhancement on delayed imaging.  Review of the MIP images confirms the above findings.  CTA NECK FINDINGS  Visualized aortic arch is of normal caliber with normal 3 vessel morphology. Mild atheromatous plaque present within the aortic arch. No high-grade stenosis seen at the origin of the great vessels.  Subclavian artery is well opacified bilaterally.  The common carotid arteries are widely patent without hemodynamically significant stenosis, dissection, or occlusion.  On the right, there is prominent calcified and noncalcified plaque about the carotid bifurcation and proximal right internal carotid artery. There is associated severe stenosis of approximately 80-90% by NASCET  criteria (series 401, image 64). This stenosis extends from the carotid bifurcation and measures approximately 13 mm in length. Distally, the right internal carotid artery is well opacified to the level of the skullbase without significant stenosis, dissection, or occlusion.  On the left, endovascular stent is in place  within the proximal left internal carotid artery. There is intraluminal stenosis within the stent of approximately 40-50% by NASCET criteria. Distally, the left internal carotid artery is well opacified to the level of the skullbase without evidence of dissection or occlusion.  The external carotid arteries and their branches are within normal limits.  The vertebral arteries arise from the subclavian arteries bilaterally. Vertebral arteries are well opacified along their entire course without evidence of dissection, occlusion, or hemodynamically significant stenosis.  Review of the MIP images confirms the above findings.  No acute soft tissue abnormality within the neck. No adenopathy. Thyroid gland is normal. Visualized superior mediastinum within normal limits.  There is irregular patchy opacity within the partially visualized right lung apex, which may in part reflect atelectasis and/or scar. Possible infection could also be considered in the correct clinical setting. Left lung apex is clear.  Reversal of the normal cervical lordosis with apex at C5-6 present. Patient appears to be status post prior fusion at the C5-C6 level. No worrisome lytic or blastic osseous lesions.  IMPRESSION: CTA NECK:  1. Calcified and noncalcified plaque at the origin of the right internal carotid artery with associated stenosis of 80-90% by NASCET criteria. This stenosis extends from the carotid bifurcation and measures approximately 13 mm in length. 2. Status post stenting of the proximal left internal carotid artery. There is patent but narrowed flow within the vascular stent itself, measuring approximately 40-50%  stenosis by NASCET criteria. 3. No other hemodynamically significant stenosis, dissection, or occlusion identified within the major arterial vasculature of the neck. 4. Patchy ill-defined opacity within the partially visualized right lung apex. While this finding may reflect underlying atelectasis/scarring, possible infectious infiltrate could be considered in the correct clinical setting. Correlation with plain film radiography could be performed for further evaluation.  CTA HEAD:  1. No proximal branch occlusion or hemodynamically significant stenosis identified within the intracranial circulation. 2. Calcified atheromatous disease within the cavernous segments of the internal carotid arteries bilaterally with associated stenosis of up to 40-50% by NASCET criteria.   Electronically Signed   By: Jeannine Boga M.D.   On: 11/23/2013 02:18     EKG Interpretation None      MDM   Final diagnoses:  Transient cerebral ischemia, unspecified transient cerebral ischemia type    47M presents with aphasia, R sided weakness as a Code Stroke. Per EMS, patient in the shower, called to his wife because he couldn't move. Patient doing well with EMS upon arrival, however had episode of aphasia and R sided weakness. This improved en route. Here vitals ok. Talking, bilateral upper extremities with good strength. Lower extremities grossly intact. No aphasia noted. Symptoms resolved here. Patient seen by Neuro, recommended observation due to his comorbidities.    Evelina Bucy, MD 11/23/13 [phone]

## 2013-11-22 NOTE — H&P (Signed)
[facility] Admission History and Physical Service Pager: [phone]  Patient name: Edgar Padilla Medical record number: [id number] Date of birth: [birth year] Age: 63 y.o. Gender: male  Primary Care Provider: No primary care provider on file. Consultants: neurology Code Status: full  Chief Complaint: garbled speech  Assessment and Plan: Edgar Padilla is a 63 y.o. male presenting with transient inability to move and garbled speech. PMH is significant for CAD, CHF w/ EF 35-40%, HTN, DM, and bilateral carotid stenosis s/p left stenting 3 weeks ago.   # TIA: Most deficits resolved prior to my assessment. Residual L leg weakness and severe headache. Recent carotid intervention in the setting of bilateral stenosis (99% and 95%). - place in obs, FMTS, Dr. Ree Kida attending - monitor on telemetry - neurology consulting, f/u recommendations - typical risk stratification and stroke work-up has all been done in the last month - Last A1c 7.8, chol 262, LDL 155, HDL 29, TSH 1.41 - known carotid stenosis with recent left stent and and right stent scheduled for January - will repeat carotid dopplers - unable to obtain mri with icd and cta poses significant renal risk - continue asa and plavix (added after carotid stent placement last month) - q2 hour neuro checks - repeat head CT in 24 hours per neuro recs  # CAD: H/o multiple MI's and s/p CABG and several stent placements. - not on statin, per patient he has been tried on several but has been unable to tolerate due to myalgias and hypotension - continue home asa and plavix  # CHF: appears euvolemic and breathing comfortably - last echo 10/24 showed EF 35-40%, left ventricular dilation, anteroseptal and apical akinesia - biventricular icd in place - continue home metoprolol, bumex  # DM: Last A1c 7.8 on 10/24, home regimen of 22u BID NPH and 0-30 with meals per sliding scale and carb counting - continue home NPH (reduced  to 15u bid wc) + moderate SSI  # CKD: Baseline creatinine 1.9-2.2 per wife, 1.7 on admission - avoid nephrotoxic agents, including dyes where possible - monitor w/ daily bmet  # BPH/BOO: Needs urologic intervention but too high risk at this point, self-cathing qid at home - continue home dutasteride - foley placed in ED  # Gout: - continue home allopurinol and colchicine  FEN/GI: NPO until bedside swallow then carb modified/heart healthy if cleared, protonix Prophylaxis: SQ heparin  Disposition: place in observation, home pending neuro work-up and stabilization of deficits  History of Present Illness: Edgar Padilla is a 63 y.o. male presenting with the inability to move and garbled speech in the shower this morning. According to his wife, he called to her saying that he could not move his arms or legs, she came and helped him sit down and he sayed the word "stroke" several times and then had garbled speech that she described as almost like growling. He appeared frustrated by his inability to communicate. She called EMS but his deficits were resolved by the time they arrived. In the ambulance he apparently had another episode with right-sided weakness and aphasia. In the ED these symptoms resolved but he developed a severe headache for which he was given .  Review Of Systems: Per HPI Otherwise 12 point review of systems was performed and was unremarkable.  There are no active problems to display for this patient.  Past Medical History: Past Medical History  Diagnosis Date  . Coronary artery disease   . Stroke     TIA  .  Hypertension   . CHF (congestive heart failure)   . Diabetes mellitus without complication   . Pulmonary fibrosis   . Pulmonary nodule   . Renal disorder   . Arthritis    Past Surgical History: Past Surgical History  Procedure Laterality Date  . Carotid stent Left 11/12/2013  . Cardiac surgery    . Shoulder arthroscopy    . Back surgery    . Ep implantable  device    . Coronary artery bypass graft    . Heart      Catherization   Social History: History  Substance Use Topics  . Smoking status: Never Smoker   . Smokeless tobacco: Not on file  . Alcohol Use: No    Please also refer to relevant sections of EMR.  Family History: Family History  Problem Relation Age of Onset  . Heart failure Father   . Heart attack Father   . Rheum arthritis Mother    Allergies and Medications: Allergies  Allergen Reactions  . Ticagrelor Shortness Of Breath    SOB  . Restoril [Temazepam] Other (See Comments)    resistant    No current facility-administered medications on file prior to encounter.   No current outpatient prescriptions on file prior to encounter.    Objective: BP 143/79 mmHg  Pulse 75  Temp(Src) 97.8 F (36.6 C) (Oral)  Resp 16  Ht 5\' 10"  (1.778 m)  Wt 200 lb (90.719 kg)  BMI 28.70 kg/m2  SpO2 100% Exam: General: middle-aged man, lying in bed, resting, NAD HEENT: MMM, PERRL, NCAT Cardiovascular: paced regular rhythm, no murmur, no bruit Respiratory: CTAB, normal WOB Abdomen: soft, NTND, + BS Extremities: WWP Skin: intact, no rashes Neuro: CN II-XII intact, L-sided weakness in upper and lower extremity, bilateral LE tingling/numbness  Labs and Imaging: CBC BMET   Recent Labs Lab 11/22/13 1216 11/22/13 1318  WBC 7.4  --   HGB 12.2* 12.6*  HCT 36.6* 37.0*  PLT 216  --     Recent Labs Lab 11/22/13 1216 11/22/13 1318  NA 140 141  K 4.3 4.1  CL 101 104  CO2 25  --   BUN 29* 33*  CREATININE 1.70* 1.80*  GLUCOSE 100* 97  CALCIUM 9.4  --      [facility] result from 10/2013:  A1c 7.8 Chol 262, LDL 155, HDL 29  TSH 1.41  Echo:  Moderate left ventricular dilatation. Normal left ventricular wall thickness. Abnormal motion of the interventricular septum. Mid anteroseptum is thinned and akinetic. Apex is not well visualized, but appears severely hypokinetic to akinetic. Moderately abnormal left ventricular  ejection fraction estimated at 35-40%. Abnormal relaxation filling pattern of the left ventricle for age (stage 1 diastolic dysfunction). Mild right ventricular dilatation. Normal right ventricular global systolic function. Mild left atrial dilatation. Mild mitral and tricuspid regurgitation. No pericardial effusion.  Carotid doppler: The bilateral carotid systems were scanned in both the longitudinal and transverse planes utilizing real time simultaneous Doppler. 1. Color flow duplex ultrasound of the right cervical carotid system demonstrated a severe stenosis of the right internal carotid artery (>80% diameter reduction). 2. Color flow duplex ultrasound of the left cervical carotid system demonstrated a severe stenosis of the left internal carotid artery (>80% diameter reduction). 3. The bilateral external carotid arteries demonstrated normal spectral waveforms with antegrade flow. 4. The right vertebral artery demonstrated abnormal elevated spectral waveforms with antegrade flow. 5. The bilateral subclavian arteries demonstrated normal spectral waveforms without evidence of stenosis. 6. There is severe  heterogeneous noncalcified plaque in the bilateral carotid bifurcations. Impression - There are >80% diameter reducing stenoses of the bilateral ICA's R>>L. There also is evidence of a moderately severe right VA stenosis.    Frazier Richards, MD 11/22/2013, 1:59 PM PGY-2, [place] Intern pager: [phone], text pages welcome

## 2013-11-22 NOTE — Code Documentation (Signed)
63yo male arriving to [facility] via [place] at 1159.  EMS reports that the patient got in the shower and called out for help to his wife d/t weakness.  Patient reports that he was having bilateral arm weakness.  Wife reports garbled speech.  EMS reports that symptoms had cleared on their arrival, but en route at 1152 the patient had recurrence of left facial droop and aphasia.  Patient taken to CT on arrival.  Stroke team at the bedside.  Initial NIHSS 5, see documentation for details and code stroke times.  Patient feels that he is improving, wife to the bedside and reports speech is much improved.  Patient's wife reports that he has had a recent left carotid stent placement in [place].  According to wife patient is scheduled to have the right carotid stented in a few months.  He has h/o severe carotid stenosis, DM, CHF, defibrillator, MI and TIAs.  Dr. Doy Mince at the bedside.  No acute stroke treatment at this time.  Patient is a TIA alert should symptoms reoccur.  Bedside handoff with ED RN Velna Hatchet.

## 2013-11-22 NOTE — ED Notes (Signed)
CBG 82 

## 2013-11-22 NOTE — ED Notes (Signed)
MD at bedside. 

## 2013-11-22 NOTE — Consult Note (Addendum)
Referring Physician: Mingo Amber    Chief Complaint: Code stroke  HPI:                                                                                                                                         Edgar Padilla is an 63 y.o. male with multiple medical issues including bilateral carotid stenosis with recent left carotid stenting one week ago at [facility]. Patient has been doing well since then and is scheduled for right carotid stenting in January. Today at 10 AM he noted left arm decreased sensation and difficulty expressing himself.  EMS was called and on time of arrival symptoms had resolved. At 11:52 while in route his symptoms of expressive aphasia returned.  On arrival to hospital his symptoms had resolved.  At present time his symptoms resolved and his main complaint is HA.   Initial CT head is negative for acute stroke or bleed.   Date last known well: Date: 11/22/2013 Time last known well: Time: 11:52 tPA Given: No: symptoms resolved  Past Medical History  Diagnosis Date  . Coronary artery disease   . Stroke     TIA  . Hypertension   . CHF (congestive heart failure)   . Diabetes mellitus without complication   . Pulmonary fibrosis   . Pulmonary nodule   . Renal disorder   . Arthritis     Past Surgical History  Procedure Laterality Date  . Carotid stent Left 11/12/2013  . Cardiac surgery    . Shoulder arthroscopy    . Back surgery    . Ep implantable device    . Coronary artery bypass graft    . Heart      Catherization    Family History  Problem Relation Age of Onset  . Heart failure Father   . Heart attack Father   . Rheum arthritis Mother    Social History:  reports that he has never smoked. He does not have any smokeless tobacco history on file. He reports that he does not drink alcohol. His drug history is not on file.  Allergies:  Allergies  Allergen Reactions  . Ticagrelor Shortness Of Breath    SOB  . Restoril [Temazepam] Other (See Comments)     resistant     Medications:  No current facility-administered medications for this encounter.      ROS:                                                                                                                                       History obtained from the patient  General ROS: negative for - chills, fatigue, fever, night sweats, weight gain or weight loss Psychological ROS: negative for - behavioral disorder, hallucinations, memory difficulties, mood swings or suicidal ideation Ophthalmic ROS: negative for - blurry vision, double vision, eye pain or loss of vision ENT ROS: negative for - epistaxis, nasal discharge, oral lesions, sore throat, tinnitus or vertigo Allergy and Immunology ROS: negative for - hives or itchy/watery eyes Hematological and Lymphatic ROS: negative for - bleeding problems, bruising or swollen lymph nodes Endocrine ROS: negative for - galactorrhea, hair pattern changes, polydipsia/polyuria or temperature intolerance Respiratory ROS: negative for - cough, hemoptysis, shortness of breath or wheezing Cardiovascular ROS: negative for - chest pain, dyspnea on exertion, edema or irregular heartbeat Gastrointestinal ROS: negative for - abdominal pain, diarrhea, hematemesis, nausea/vomiting or stool incontinence Genito-Urinary ROS: negative for - dysuria, hematuria, incontinence or urinary frequency/urgency Musculoskeletal ROS: negative for - joint swelling or muscular weakness Neurological ROS: as noted in HPI Dermatological ROS: negative for rash and skin lesion changes  Neurologic Examination:                                                                                                      Blood pressure 135/77, pulse 76, temperature 97.4 F (36.3 C), temperature source Oral, resp. rate 18, height 5\' 10"  (1.778 m), weight 90.719 kg (200  lb), SpO2 100 %.  General: NAD Mental Status: Alert, oriented, to month, year and hospital.  Speech fluent without evidence of aphasia.  Able to follow 3 step commands without difficulty. Cranial Nerves: II: Discs flat bilaterally; Visual fields grossly normal, pupils equal, round, reactive to light and accommodation III,IV, VI: ptosis not present, extra-ocular motions intact bilaterally V,VII: smile symmetric, facial light touch sensation decreased on the left VIII: hearing normal bilaterally IX,X: gag reflex present XI: bilateral shoulder shrug XII: midline tongue extension without atrophy or fasciculations  Motor: Right : Upper extremity   4/5    Left:     Upper extremity   4/5  Lower extremity   3/5     Lower extremity   3/5 --able to lift both legs off the bed two inches Tone and bulk:normal tone throughout; no atrophy  noted Sensory: Pinprick and light touch decreased on the left leg Deep Tendon Reflexes:  Right: Upper Extremity   Left: Upper extremity   biceps (C-5 to C-6) 2/4   biceps (C-5 to C-6) 2/4 tricep (C7) 2/4    triceps (C7) 2/4 Brachioradialis (C6) 2/4  Brachioradialis (C6) 2/4  Lower Extremity Lower Extremity  quadriceps (L-2 to L-4) 2/4   quadriceps (L-2 to L-4) 2/4 Achilles (S1) 0/4   Achilles (S1) 0/4  Plantars: Right: downgoing   Left: downgoing Cerebellar: normal finger-to-nose,  normal heel-to-shin test Gait: not tested due to saftey CV: pulses palpable throughout    Lab Results: Basic Metabolic Panel:  Recent Labs Lab 11/22/13 1216 11/22/13 1318  NA 140 141  K 4.3 4.1  CL 101 104  CO2 25  --   GLUCOSE 100* 97  BUN 29* 33*  CREATININE 1.70* 1.80*  CALCIUM 9.4  --     Liver Function Tests:  Recent Labs Lab 11/22/13 1216  AST 24  ALT 29  ALKPHOS 155*  BILITOT 0.3  PROT 6.9  ALBUMIN 3.8   No results for input(s): LIPASE, AMYLASE in the last 168 hours. No results for input(s): AMMONIA in the last 168 hours.  CBC:  Recent  Labs Lab 11/22/13 1216 11/22/13 1318  WBC 7.4  --   NEUTROABS 4.3  --   HGB 12.2* 12.6*  HCT 36.6* 37.0*  MCV 96.3  --   PLT 216  --     Cardiac Enzymes: No results for input(s): CKTOTAL, CKMB, CKMBINDEX, TROPONINI in the last 168 hours.  Lipid Panel: No results for input(s): CHOL, TRIG, HDL, CHOLHDL, VLDL, LDLCALC in the last 168 hours.  CBG:  Recent Labs Lab 11/22/13 1323  GLUCAP 82    Microbiology: No results found for this or any previous visit.  Coagulation Studies:  Recent Labs  11/22/13 1216  LABPROT 13.6  INR 1.03    Imaging: Ct Head (brain) Wo Contrast  11/22/2013   CLINICAL DATA:  Acute onset left facial droop and aphasia  EXAM: CT HEAD WITHOUT CONTRAST  TECHNIQUE: Contiguous axial images were obtained from the base of the skull through the vertex without intravenous contrast.  COMPARISON:  None.  FINDINGS: The ventricles are normal in size and configuration. There is no appreciable mass, hemorrhage, extra-axial fluid collection, or midline shift. No focal gray-white compartment lesions are appreciable on this study. No acute infarct apparent. Bony calvarium appears intact. The mastoid air cells are clear.  IMPRESSION: No intracranial mass, hemorrhage, or focal gray -white compartment lesions/acute appearing infarct.  Critical Value/emergent results were called by telephone at the time of interpretation on 11/22/2013 at 12:35 pm to Dr. Doy Mince, neurology, who verbally acknowledged these results.   Electronically Signed   By: Lowella Grip M.D.   On: 11/22/2013 12:35    Etta Quill PA-C [facility] [phone]  11/22/2013, 3:34 PM   Patient seen and examined.  Clinical course and management discussed.  Necessary edits performed.  I agree with the above.  Assessment and plan of care developed and discussed below.     Assessment: 63 y.o. male with a history of TIA and bilateral carotid stenosis.  Today experienced expressive aphasia.   Symptoms have currently resolved.  S/P left carotid stent placement about 2 weeks ago and due to have a right carotid stent placed due to a critical stenosis.  Has multiple other comorbidities as well.  On ASA and Plavix.  Suspect current presentation represents another TIA.  Head  CT reviewed and shows no acute changes.  Patient unable to have an MRI.  Concerned about performing a CTA as well due to renal insufficiency, CHF and DM.  Current NIHSS would not dictate intervention.    Stroke Risk Factors - diabetes mellitus, hypertension and CAD   Recommendations: 1. HgbA1c, fasting lipid panel 2. Repeat head CT in 24 hours 3. Prophylactic therapy-Continue ASA and Plavix 4. Telemetry monitoring 5. Frequent neuro checks  Case discussed with Dr. Etta Grandchild, MD [facility] [phone]  11/22/2013  3:40 PM

## 2013-11-22 NOTE — ED Notes (Signed)
63 yo male-  EMS with Right sided weakness that resolved during transport, however pt became aphasic in route. Per ems L sided droop, aphasia and L arm weakness. Hx of L carotid stent placed 1 week ago. Wife noticed pt could not speak when getting out of shower.

## 2013-11-23 ENCOUNTER — Inpatient Hospital Stay (HOSPITAL_COMMUNITY): Payer: BC Managed Care – PPO

## 2013-11-23 ENCOUNTER — Encounter (HOSPITAL_COMMUNITY): Payer: Self-pay | Admitting: Radiology

## 2013-11-23 DIAGNOSIS — I2510 Atherosclerotic heart disease of native coronary artery without angina pectoris: Secondary | ICD-10-CM

## 2013-11-23 DIAGNOSIS — I6529 Occlusion and stenosis of unspecified carotid artery: Secondary | ICD-10-CM | POA: Insufficient documentation

## 2013-11-23 DIAGNOSIS — I6523 Occlusion and stenosis of bilateral carotid arteries: Secondary | ICD-10-CM

## 2013-11-23 DIAGNOSIS — Z95810 Presence of automatic (implantable) cardiac defibrillator: Secondary | ICD-10-CM

## 2013-11-23 DIAGNOSIS — I255 Ischemic cardiomyopathy: Secondary | ICD-10-CM

## 2013-11-23 DIAGNOSIS — E1142 Type 2 diabetes mellitus with diabetic polyneuropathy: Secondary | ICD-10-CM

## 2013-11-23 DIAGNOSIS — I639 Cerebral infarction, unspecified: Secondary | ICD-10-CM

## 2013-11-23 DIAGNOSIS — E785 Hyperlipidemia, unspecified: Secondary | ICD-10-CM | POA: Insufficient documentation

## 2013-11-23 LAB — BASIC METABOLIC PANEL
Anion gap: 16 — ABNORMAL HIGH (ref 5–15)
BUN: 36 mg/dL — ABNORMAL HIGH (ref 6–23)
CALCIUM: 9.3 mg/dL (ref 8.4–10.5)
CO2: 21 mEq/L (ref 19–32)
CREATININE: 1.56 mg/dL — AB (ref 0.50–1.35)
Chloride: 98 mEq/L (ref 96–112)
GFR calc Af Amer: 53 mL/min — ABNORMAL LOW (ref >90)
GFR calc non Af Amer: 46 mL/min — ABNORMAL LOW (ref >90)
GLUCOSE: 316 mg/dL — AB (ref 70–99)
Potassium: 5.4 mEq/L — ABNORMAL HIGH (ref 3.7–5.3)
Sodium: 135 mEq/L — ABNORMAL LOW (ref 137–147)

## 2013-11-23 LAB — GLUCOSE, CAPILLARY
GLUCOSE-CAPILLARY: 305 mg/dL — AB (ref 70–99)
GLUCOSE-CAPILLARY: 286 mg/dL — AB (ref 70–99)
Glucose-Capillary: 229 mg/dL — ABNORMAL HIGH (ref 70–99)
Glucose-Capillary: 310 mg/dL — ABNORMAL HIGH (ref 70–99)

## 2013-11-23 MED ORDER — CEPHALEXIN 500 MG PO CAPS: 500.0000 mg | ORAL_CAPSULE | Freq: Three times a day (TID) | ORAL | Status: DC

## 2013-11-23 MED ORDER — IOHEXOL 350 MG/ML SOLN
50.0000 mL | Freq: Once | INTRAVENOUS | Status: AC | PRN
  Administered 2013-11-23: 50 mL via INTRAVENOUS

## 2013-11-23 NOTE — Progress Notes (Addendum)
STROKE TEAM PROGRESS NOTE   HISTORY Edgar Padilla is an 63 y.o. male with multiple medical issues including bilateral carotid stenosis with recent left carotid stenting one week ago at [facility]. Patient has been doing well since then and is scheduled for right carotid stenting in January. Today 11/22/2013 at 10 AM he noted left arm decreased sensation and difficulty expressing himself. EMS was called and on time of arrival symptoms had resolved. At 11:52 while in route his symptoms of expressive aphasia returned. On arrival to hospital his symptoms had resolved. At present time his symptoms resolved and his main complaint is HA. Initial CT head is negative for acute stroke or bleed. Patient was not administered TPA secondary to symptoms resolved. He was admitted for further evaluation and treatment.   SUBJECTIVE (INTERVAL HISTORY) His wife is at the bedside.  Overall he feels his condition is largely resolved. He reports bilateral UE weakness and difficulty getting his words out - he couldn't talk, just making sounds with L facial drool. Hx of "spells" where he blacks out per wife. She thought he was hypotensive. He slept all day Tues after the "spell". Yesterday's "spell" was the worst she had seen. Denies any shaking jerking.    OBJECTIVE Temp:  [97.6 F (36.4 C)-99 F (37.2 C)] 98 F (36.7 C) (11/12 1339) Pulse Rate:  [75-80] 76 (11/12 1339) Cardiac Rhythm:  [-] Atrial paced;A-V Sequential paced (11/12 1035) Resp:  [16-18] 18 (11/12 1339) BP: (94-159)/(65-84) 144/65 mmHg (11/12 1339) SpO2:  [96 %-100 %] 100 % (11/12 1339)   Recent Labs Lab 11/22/13 1323 11/22/13 1718 11/22/13 2113 11/23/13 0639 11/23/13 1140  GLUCAP 82 190* 228* 305* 229*    Recent Labs Lab 11/22/13 1216 11/22/13 1318 11/23/13 0400  NA 140 141 135*  K 4.3 4.1 5.4*  CL 101 104 98  CO2 25  --  21  GLUCOSE 100* 97 316*  BUN 29* 33* 36*  CREATININE 1.70* 1.80* 1.56*  CALCIUM 9.4  --  9.3    Recent  Labs Lab 11/22/13 1216  AST 24  ALT 29  ALKPHOS 155*  BILITOT 0.3  PROT 6.9  ALBUMIN 3.8    Recent Labs Lab 11/22/13 1216 11/22/13 1318  WBC 7.4  --   NEUTROABS 4.3  --   HGB 12.2* 12.6*  HCT 36.6* 37.0*  MCV 96.3  --   PLT 216  --    No results for input(s): CKTOTAL, CKMB, CKMBINDEX, TROPONINI in the last 168 hours.  Recent Labs  11/22/13 1216  LABPROT 13.6  INR 1.03    Ct Angio Head W/cm &/or Wo Cm  11/23/2013   IMPRESSION: CTA NECK:  1. Calcified and noncalcified plaque at the origin of the right internal carotid artery with associated stenosis of 80-90% by NASCET criteria. This stenosis extends from the carotid bifurcation and measures approximately 13 mm in length. 2. Status post stenting of the proximal left internal carotid artery. There is patent but narrowed flow within the vascular stent itself, measuring approximately 40-50% stenosis by NASCET criteria. 3. No other hemodynamically significant stenosis, dissection, or occlusion identified within the major arterial vasculature of the neck. 4. Patchy ill-defined opacity within the partially visualized right lung apex. While this finding may reflect underlying atelectasis/scarring, possible infectious infiltrate could be considered in the correct clinical setting. Correlation with plain film radiography could be performed for further evaluation.  CTA HEAD:  1. No proximal branch occlusion or hemodynamically significant stenosis identified within the intracranial circulation. 2.  Calcified atheromatous disease within the cavernous segments of the internal carotid arteries bilaterally with associated stenosis of up to 40-50% by NASCET criteria.     Ct Head (brain) Wo Contrast  11/22/2013  IMPRESSION: No intracranial mass, hemorrhage, or focal gray -white compartment lesions/acute appearing infarct.     2D echo OSH 11/04/13  IMPRESSIONS Technically difficult study. Moderate left ventricular dilatation. Normal left  ventricular wall thickness. Abnormal motion of the interventricular septum. Mid anteroseptum is thinned and akinetic. Apex is not well visualized, but appears severely hypokinetic to akinetic. Moderately abnormal left ventricular ejection fraction estimated at 35-40%. Abnormal relaxation filling pattern of the left ventricle for age (stage 1 diastolic dysfunction). Mild right ventricular dilatation. Normal right ventricular global systolic function. Mild left atrial dilatation. Mild mitral and tricuspid regurgitation. No pericardial effusion. No prior study to compare.  PHYSICAL EXAM Physical exam  Temp:  [97.6 F (36.4 C)-99 F (37.2 C)] 98 F (36.7 C) (11/12 1339) Pulse Rate:  [75-80] 76 (11/12 1339) Resp:  [16-18] 18 (11/12 1339) BP: (94-159)/(65-84) 144/65 mmHg (11/12 1339) SpO2:  [96 %-100 %] 100 % (11/12 1339)  General - Well nourished, well developed, in no apparent distress.  Ophthalmologic - not able to see through.  Cardiovascular - Regular rate and rhythm with no murmur.  Mental Status -  Level of arousal and orientation to time, place, and person were intact. Language including expression, naming, repetition, comprehension was assessed and found intact.  Cranial Nerves II - XII - II - Visual field intact OU. III, IV, VI - Extraocular movements intact. V - decreased left facial light touch and pinprick. VII - Facial movement intact bilaterally. VIII - Hearing & vestibular intact bilaterally. X - Palate elevates symmetrically. XI - Chin turning & shoulder shrug intact bilaterally. XII - Tongue protrusion intact.  Motor Strength - The patient's strength was normal in all extremities except left dexterity mildly impaired and pronator drift was absent.  Bulk was normal and fasciculations were absent.   Motor Tone - Muscle tone was assessed at the neck and appendages and was normal.  Reflexes - The patient's reflexes were decreased in all extremities and he had no  pathological reflexes.  Sensory - Light touch, temperature/pinprick were decreased on the left UE but also right foot.    Coordination - The patient had normal movements in the hands and feet with no ataxia or dysmetria.  Tremor was absent.  Gait and Station - not tested.   ASSESSMENT/PLAN Mr. Wallice Granville is a 63 y.o. male with history of multiple medical problems, most notably  L ICA stent 1 week ago presenting with decreased sensation in L arm and difficulty speech. He did not receive IV t-PA due to symptoms largely resolved. He has frequent spells of pre-syncope, did not check BP during the episodes though. In the setting of right ICA high grade stenosis, he presented with left sided symptoms. Recommend right carotid artery intervention.  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  Ongoing aggressive risk factor management  Therapy recommendations:  pending   Disposition:  They have requested transfer back to [facility] where their surgeons are  Bilateral ICA stenosis  L ICA stent 11/12/2013 at [facility]  Right ICA high grade stenosis  Recommend right ICA intervention  Family wants him to transfer to [place]  Syncope / presyncope - as per family and pt, frequent episodes - recommend to check orthostatic vitals - avoid hypotension - 2D echo recent 35-40%, improved from prior - cardiac work up as per primary  team  Hypotension  Recommend to check orthostatic vital signs  Avoid hypotension  BP goal 130-160/80-90  Diabetes  HgbA1c 7.8 in Oct, goal < 7.0   Uncontrolled  DM education  Compliant counseling provided  DM control as per primary team  Hyperlipidemia  LDL 155 on 11/05/2013  Intolerant to statins  Pt prefer not on statin  Other Stroke Risk Factors  Obesity, Body mass index is 28.7 kg/(m^2).   Hx stroke/TIA  Coronary artery disease - CABG, stents  Other Active Problems  R & L heart failure - CHF - has bivent pacer/ICD, implanted MIMS device  CKD stage 3  BPH/BOO. Scheduled for surgery Nov 2 per wife (I&o caths himself bid at home)  Gout, on allopurinol  Other Pertinent History  Pulmonary nodule, stable without changes  ? Pulmonary fibrosis  Concerned for RA per wife. OP workup scheduled.  Hospital day # 1  Burnetta Sabin, MSN, APRN, ANVP-BC, AGPCNP-BC [facility] Pager: [phone] 11/23/2013 4:39 PM   I, the attending vascular neurologist, have personally obtained a history, examined the patient, evaluated laboratory data, individually viewed imaging studies. Together with the NP/PA, we formulated the assessment and plan of care which reflects our mutual decision.  I have made any additions or clarifications directly to the above note and agree with the findings and plan as currently documented.   Please note: All text in blue color in the note is my addition to the original note.   Neurology will sign off. Please call with questions. Thanks for the consult.  Rosalin Hawking, MD PhD Stroke Neurology 11/23/2013 4:56 PM  To contact Stroke Continuity provider, please refer to [email or web address] After hours, contact General Neurology

## 2013-11-23 NOTE — Discharge Summary (Signed)
[facility] Discharge Summary  Patient name: Edgar Padilla Medical record number: [id number] Date of birth: 10/16/1950 Age: 63 y.o. Gender: male Date of Admission: 11/22/2013  Date of Discharge: 11/23/13 Admitting Physician: Edgar Dawn, MD  Primary Care Provider: No primary care provider on file. Consultants: Neurology  Indication for Hospitalization: TIA vs CVA work-up  Discharge Diagnoses/Problem List:  Transient ischemic attack Coronary artery disease  Congestive heart failure  Diabetes mellitus  Chronic kidney disease  BPH/BOO Gout   Disposition: Transfer to [facility] in [place], accepting MD: Edgar Padilla  Discharge Condition: Stable  Discharge Exam:  Blood pressure 139/68, pulse 76, temperature 97.6 F (36.4 C), temperature source Oral, resp. rate 18, height 5\' 10"  (1.778 m), weight 200 lb (90.719 kg), SpO2 96 %. General: middle-aged man, lying in bed comfortably in NAD HEENT: MMM, PERRL, NCAT Cardiovascular: RRR.  No murmur, rub or gallop noted.  No bruit. No LE edema noted. Respiratory: Normal WOB. Fine crackles in the bases bilaterally. No wheezing/rhonchi. Abdomen: soft, NTND, + BS Extremities: Bony enlargement of the left foot.  Skin: intact, no rashes Neuro: A&Ox4. Speech clear and coherent. Sometimes has difficulties with word finding. Smile and forehead movements symmetric. PERRL but sluggish. EOMI. Shoulder shrug intact. Sensation intact and equal over the face b/l. Tongue protrusion and uvula midline.  4/5 strength in the left upper extremity, 4+/5 strength in the left LE. 5/5 strength in the UE and LE on the right. Bilateral LE tingling/numbness. Decreased sensation to light touch on the right LE.   Brief Hospital Course:  Edgar Padilla is a 64 y/o male who presented with a transient inability to move and garbled speech. PMH is significant for CAD, CHF w/ EF 35-40%, HTN, DM, and bilateral carotid stenosis s/p left stenting 3  weeks ago.  Per the patient and his wife, he was unable to move in the shower and was diffusely weak. Once arriving to the hospital, he felt he had improved significantly with residual weakness in his left leg however his wife continued to endorse difficulties with word finding.   Neuro: In the ED, an initial CT revealed "No intracranial mass, hemorrhage, or focal gray -white compartment lesions/acute appearing infarct."  Neurology was consulted who felt a CTA of the head and neck was warranted which revealed an 80-90% stenosis of the right ICA and 40-50% in the L ICA s/p stent. His neuro checks remained stable.  Given he recently had a lipid panel, TSH, A1c, and an echocardiogram, these were not re-done. MRI was not done due to ICD.  He was continued on his home Plavix and ASA.  He was not on a statin, however per the patient he has been intolerant in the past.   Cardio: Patient with a h/o CHF with ICD in place. He received a 1L bolus in the ED. Now with fine crackles one exam. No LE edema noted. Continued home metoprolol and Bumex   Endo: Last A1c 7.8; NPH initially decreased to 15u BID and placed on SSI. Unfortunately, he did not get his NPH until 4:30am there his 8AM dose was held.  Renal: Patients creatinine 1.7 on admission, which actually improved to 1.56 despite his CTA. H/o BPH and BOO for which dutasteride was continued and foley was placed in the ED. Additionally, the patient was recently started on Keflex TID on 11/10 for a UTI which was continued.  Rheum: Pt's allopurinol and colchicine was continued.   Issues for Follow Up:  --  Patient's NPH (only 15units) was not given until 430AM, therefore his 8am dose prior to discharge was held. He was continued on his SSI. -- F/u pulmonary nodule, it appears to be stable -- Consider orthostatics  Significant Procedures: None   Significant Labs and Imaging:   Recent Labs Lab 11/22/13 1216 11/22/13 1318  WBC 7.4  --   HGB 12.2* 12.6*  HCT  36.6* 37.0*  PLT 216  --     Recent Labs Lab 11/22/13 1216 11/22/13 1318 11/23/13 0400  NA 140 141 135*  K 4.3 4.1 5.4*  CL 101 104 98  CO2 25  --  21  GLUCOSE 100* 97 316*  BUN 29* 33* 36*  CREATININE 1.70* 1.80* 1.56*  CALCIUM 9.4  --  9.3  ALKPHOS 155*  --   --   AST 24  --   --   ALT 29  --   --   ALBUMIN 3.8  --   --    CT head: No intracranial mass, hemorrhage, or focal gray -white compartment lesions/acute appearing infarct.  CTA Head/Neck:  Calcified and noncalcified plaque at the origin of the right internal carotid artery with associated stenosis of 80-90% by NASCET criteria. This stenosis extends from the carotid bifurcation and measures approximately 13 mm in length. 2. Status post stenting of the proximal left internal carotid artery. There is patent but narrowed flow within the vascular stent itself, measuring approximately 40-50% stenosis by NASCET criteria. 3. No other hemodynamically significant stenosis, dissection, or occlusion identified within the major arterial vasculature of the neck. . Patchy ill-defined opacity within the partially visualized right ung apex. While this finding may reflect underlying telectasis/scarring, possible infectious infiltrate could be onsidered in the correct clinical setting. Correlation with plain ilm radiography could be performed for further evaluation. No proximal branch occlusion or hemodynamically significant stenosis identified within the intracranial circulation. Calcified atheromatous disease within the cavernous segments of the internal carotid arteries bilaterally with associated stenosis of up to 40-50% by NASCET criteria.   Results/Tests Pending at Time of Discharge: None   Discharge Medications:    Medication List    TAKE these medications        allopurinol 300 MG tablet  Commonly known as:  ZYLOPRIM  Take 150 mg by mouth daily.     aspirin EC 81 MG tablet  Take 81 mg by mouth daily.     bisacodyl 5 MG EC  tablet  Generic drug:  bisacodyl  Take 5 mg by mouth daily as needed for moderate constipation.     bumetanide 2 MG tablet  Commonly known as:  BUMEX  Take 2 mg by mouth daily.     cephALEXin 500 MG capsule  Commonly known as:  KEFLEX  Take 1 capsule (500 mg total) by mouth 3 (three) times daily.     clopidogrel 75 MG tablet  Commonly known as:  PLAVIX  Take 75 mg by mouth daily.     colchicine 0.6 MG tablet  Take 0.6 mg by mouth daily.     dutasteride 0.5 MG capsule  Commonly known as:  AVODART  Take 0.5 mg by mouth daily.     escitalopram 20 MG tablet  Commonly known as:  LEXAPRO  Take 20 mg by mouth daily.     fluticasone 50 MCG/ACT nasal spray  Commonly known as:  FLONASE  Place 1 spray into both nostrils daily as needed for allergies or rhinitis.     gabapentin 300 MG capsule  Commonly known as:  NEURONTIN  Take 300 mg by mouth 2 (two) times daily.     HYDROcodone-acetaminophen 5-325 MG per tablet  Commonly known as:  NORCO/VICODIN  Take 1 tablet by mouth every 6 (six) hours as needed for moderate pain.     insulin lispro 100 UNIT/ML injection  Commonly known as:  HUMALOG  Inject 0-30 Units into the skin 4 (four) times daily. Per carb count and sliding scale.     insulin NPH Human 100 UNIT/ML injection  Commonly known as:  HUMULIN N,NOVOLIN N  Inject 22 Units into the skin 2 (two) times daily.     metoprolol succinate 25 MG 24 hr tablet  Commonly known as:  TOPROL-XL  Take 25 mg by mouth daily.     nitroGLYCERIN 0.4 MG SL tablet  Commonly known as:  NITROSTAT  Place 0.4 mg under the tongue every 5 (five) minutes as needed for chest pain.     omeprazole 40 MG capsule  Commonly known as:  PRILOSEC  Take 40 mg by mouth daily.     traMADol 50 MG tablet  Commonly known as:  ULTRAM  Take 50 mg by mouth 2 (two) times daily as needed (for pain).        Discharge Instructions: Please refer to Patient Instructions section of EMR for full details.  Patient  was counseled important signs and symptoms that should prompt return to medical care, changes in medications, dietary instructions, activity restrictions, and follow up appointments.    Archie Patten, MD 11/23/2013, 12:28 PM PGY-1, [place]

## 2013-11-23 NOTE — Plan of Care (Signed)
Problem: Acute Treatment Outcomes Goal: 02 Sats > 94% Outcome: Completed/Met Date Met:  11/23/13 Goal: tPA Patient w/o S&S of bleeding Outcome: Not Applicable Date Met:  91/66/06

## 2013-11-23 NOTE — Progress Notes (Signed)
This nurse received a call from the stroke center regarding the patient's diet.  She stated that he failed his swallow evaluation and that a ST consult had not been ordered but the patient was placed on a heart healthy/carb mod diet.  This nurse went in to talk to the patient and per the patient and his spouse the patient had a bedside swallow performed by night shift nurse last night and he passed. Attempted to call night shift nurse to verify, awaiting call back.   ST consult was canceled on 11/22/2013 at 2159 by MD J. Bednar.  Will continue to monitor. Cori Razor, RN 11/23/2013

## 2013-11-23 NOTE — Plan of Care (Signed)
Problem: Acute Treatment Outcomes Goal: Airway maintained/protected Outcome: Completed/Met Date Met:  11/23/13

## 2013-11-23 NOTE — Plan of Care (Signed)
Problem: Progression Outcomes Goal: Pain controlled Outcome: Completed/Met Date Met:  11/23/13

## 2013-11-23 NOTE — Progress Notes (Signed)
Utilization review completed. Aurelius Gildersleeve, RN, BSN. 

## 2013-11-23 NOTE — Plan of Care (Signed)
Problem: Acute Treatment Outcomes Goal: Neuro exam at baseline or improved Outcome: Completed/Met Date Met:  11/23/13

## 2013-11-23 NOTE — Progress Notes (Signed)
Discharge orders received.  Transfer paper work filled out and signed by the patient's wife and witnessed by this nurse.  VSS.  Awaiting [facility] for transport.  Will continue to monitor. Cori Razor, RN

## 2013-11-23 NOTE — Progress Notes (Addendum)
Report called to Christella Scheuermann, RN at [facility].  Patient going to [place].  Awaiting [facility] for transport.  Will continue to monitor. Cori Razor, RN

## 2013-11-23 NOTE — Plan of Care (Signed)
Problem: Progression Outcomes Goal: If vent dependent, tolerates weaning Outcome: Not Applicable Date Met:  33/38/32 Goal: Tolerating diet/TF at goal rate Outcome: Completed/Met Date Met:  11/23/13

## 2015-01-28 ENCOUNTER — Other Ambulatory Visit: Payer: Self-pay | Admitting: Physical Medicine and Rehabilitation

## 2015-01-28 DIAGNOSIS — M5412 Radiculopathy, cervical region: Secondary | ICD-10-CM

## 2015-01-28 DIAGNOSIS — R51 Headache: Secondary | ICD-10-CM

## 2015-01-28 DIAGNOSIS — R519 Headache, unspecified: Secondary | ICD-10-CM

## 2015-01-31 ENCOUNTER — Ambulatory Visit
Admission: RE | Admit: 2015-01-31 | Discharge: 2015-01-31 | Disposition: A | Discharge status: Home / Self Care | Payer: BC Managed Care – PPO | Source: Ambulatory Visit | Attending: Physical Medicine and Rehabilitation | Admitting: Physical Medicine and Rehabilitation

## 2015-01-31 ENCOUNTER — Ambulatory Visit: Admission: RE | Admit: 2015-01-31 | Payer: BC Managed Care – PPO | Source: Ambulatory Visit

## 2015-01-31 DIAGNOSIS — R51 Headache: Secondary | ICD-10-CM | POA: Diagnosis present

## 2015-01-31 DIAGNOSIS — M4722 Other spondylosis with radiculopathy, cervical region: Secondary | ICD-10-CM | POA: Insufficient documentation

## 2015-01-31 DIAGNOSIS — M5412 Radiculopathy, cervical region: Secondary | ICD-10-CM | POA: Diagnosis present

## 2015-01-31 DIAGNOSIS — R519 Headache, unspecified: Secondary | ICD-10-CM

## 2015-06-06 ENCOUNTER — Encounter: Payer: Self-pay | Admitting: Emergency Medicine

## 2015-06-06 ENCOUNTER — Emergency Department
Admission: EM | Admit: 2015-06-06 | Discharge: 2015-06-06 | Disposition: A | Discharge status: Home / Self Care | Payer: BC Managed Care – PPO | Attending: Emergency Medicine | Admitting: Emergency Medicine

## 2015-06-06 ENCOUNTER — Emergency Department: Payer: BC Managed Care – PPO

## 2015-06-06 DIAGNOSIS — E1140 Type 2 diabetes mellitus with diabetic neuropathy, unspecified: Secondary | ICD-10-CM | POA: Insufficient documentation

## 2015-06-06 DIAGNOSIS — Z8673 Personal history of transient ischemic attack (TIA), and cerebral infarction without residual deficits: Secondary | ICD-10-CM | POA: Diagnosis not present

## 2015-06-06 DIAGNOSIS — J9690 Respiratory failure, unspecified, unspecified whether with hypoxia or hypercapnia: Secondary | ICD-10-CM | POA: Insufficient documentation

## 2015-06-06 DIAGNOSIS — Z792 Long term (current) use of antibiotics: Secondary | ICD-10-CM | POA: Diagnosis not present

## 2015-06-06 DIAGNOSIS — Z7982 Long term (current) use of aspirin: Secondary | ICD-10-CM | POA: Insufficient documentation

## 2015-06-06 DIAGNOSIS — I2510 Atherosclerotic heart disease of native coronary artery without angina pectoris: Secondary | ICD-10-CM | POA: Diagnosis not present

## 2015-06-06 DIAGNOSIS — G459 Transient cerebral ischemic attack, unspecified: Secondary | ICD-10-CM | POA: Diagnosis not present

## 2015-06-06 DIAGNOSIS — E785 Hyperlipidemia, unspecified: Secondary | ICD-10-CM | POA: Insufficient documentation

## 2015-06-06 DIAGNOSIS — Z951 Presence of aortocoronary bypass graft: Secondary | ICD-10-CM | POA: Insufficient documentation

## 2015-06-06 DIAGNOSIS — I255 Ischemic cardiomyopathy: Secondary | ICD-10-CM | POA: Insufficient documentation

## 2015-06-06 DIAGNOSIS — Z79899 Other long term (current) drug therapy: Secondary | ICD-10-CM | POA: Diagnosis not present

## 2015-06-06 DIAGNOSIS — Z794 Long term (current) use of insulin: Secondary | ICD-10-CM | POA: Diagnosis not present

## 2015-06-06 DIAGNOSIS — I130 Hypertensive heart and chronic kidney disease with heart failure and stage 1 through stage 4 chronic kidney disease, or unspecified chronic kidney disease: Secondary | ICD-10-CM | POA: Insufficient documentation

## 2015-06-06 DIAGNOSIS — R51 Headache: Secondary | ICD-10-CM | POA: Diagnosis present

## 2015-06-06 DIAGNOSIS — Z85828 Personal history of other malignant neoplasm of skin: Secondary | ICD-10-CM | POA: Insufficient documentation

## 2015-06-06 DIAGNOSIS — I509 Heart failure, unspecified: Secondary | ICD-10-CM | POA: Diagnosis not present

## 2015-06-06 DIAGNOSIS — N289 Disorder of kidney and ureter, unspecified: Secondary | ICD-10-CM

## 2015-06-06 DIAGNOSIS — M1990 Unspecified osteoarthritis, unspecified site: Secondary | ICD-10-CM | POA: Insufficient documentation

## 2015-06-06 DIAGNOSIS — N189 Chronic kidney disease, unspecified: Secondary | ICD-10-CM | POA: Insufficient documentation

## 2015-06-06 LAB — COMPREHENSIVE METABOLIC PANEL
ALBUMIN: 4.1 g/dL (ref 3.5–5.0)
ALT: 27 U/L (ref 17–63)
AST: 18 U/L (ref 15–41)
Alkaline Phosphatase: 132 U/L — ABNORMAL HIGH (ref 38–126)
Anion gap: 8 (ref 5–15)
BUN: 55 mg/dL — AB (ref 6–20)
CHLORIDE: 106 mmol/L (ref 101–111)
CO2: 25 mmol/L (ref 22–32)
Calcium: 8.6 mg/dL — ABNORMAL LOW (ref 8.9–10.3)
Creatinine, Ser: 2.03 mg/dL — ABNORMAL HIGH (ref 0.61–1.24)
GFR calc Af Amer: 38 mL/min — ABNORMAL LOW (ref >60)
GFR calc non Af Amer: 33 mL/min — ABNORMAL LOW (ref >60)
GLUCOSE: 66 mg/dL (ref 65–99)
POTASSIUM: 4 mmol/L (ref 3.5–5.1)
SODIUM: 139 mmol/L (ref 135–145)
Total Bilirubin: 0.4 mg/dL (ref 0.3–1.2)
Total Protein: 7 g/dL (ref 6.5–8.1)

## 2015-06-06 LAB — APTT: aPTT: 31 seconds (ref 24–36)

## 2015-06-06 LAB — CBC
HEMATOCRIT: 33.5 % — AB (ref 40.0–52.0)
HEMOGLOBIN: 11.4 g/dL — AB (ref 13.0–18.0)
MCH: 31.5 pg (ref 26.0–34.0)
MCHC: 33.9 g/dL (ref 32.0–36.0)
MCV: 92.8 fL (ref 80.0–100.0)
Platelets: 226 10*3/uL (ref 150–440)
RBC: 3.61 MIL/uL — ABNORMAL LOW (ref 4.40–5.90)
RDW: 13 % (ref 11.5–14.5)
WBC: 9.5 10*3/uL (ref 3.8–10.6)

## 2015-06-06 LAB — DIFFERENTIAL
BASOS ABS: 0.1 10*3/uL (ref 0–0.1)
EOS ABS: 0.6 10*3/uL (ref 0–0.7)
Eosinophils Relative: 7 %
Lymphocytes Relative: 14 %
Lymphs Abs: 1.3 10*3/uL (ref 1.0–3.6)
Monocytes Absolute: 1.2 10*3/uL — ABNORMAL HIGH (ref 0.2–1.0)
Monocytes Relative: 13 %
NEUTROS ABS: 6.3 10*3/uL (ref 1.4–6.5)
Neutrophils Relative %: 65 %

## 2015-06-06 LAB — PROTIME-INR
INR: 1.09
Prothrombin Time: 14.3 seconds (ref 11.4–15.0)

## 2015-06-06 LAB — TROPONIN I

## 2015-06-06 NOTE — Discharge Instructions (Signed)
Please speak with your cardiologist about whether to continue the losartan. Please make an appointment with your neurologist as soon as possible. Return to the ER for worsening symptoms, persistent vomiting, difficulty breathing, lethargy or other concerns.  Transient Ischemic Attack A transient ischemic attack (TIA) is a "warning stroke" that causes stroke-like symptoms. A TIA does not cause lasting damage to the brain. The symptoms of a TIA can happen fast and do not last long. It is important to know the symptoms of a TIA and what to do. This can help prevent stroke or death.  HOME CARE   Take medicines only as told by your doctor. Make sure you understand all of the instructions.  You may need to take aspirin or warfarin medicine. Warfarin needs to be taken exactly as told.  Taking too much or too little warfarin is dangerous. Blood tests must be done as often as told by your doctor. A PT blood test measures how long it takes for blood to clot. Your PT is used to calculate another value called an INR. Your PT and INR help your doctor adjust your warfarin dosage. He or she will make sure you are taking the right amount.  Food can cause problems with warfarin and affect the results of your blood tests. This is true for foods high in vitamin K. Eat the same amount of foods high in vitamin K each day. Foods high in vitamin K include spinach, kale, broccoli, cabbage, collard and turnip greens, Brussels sprouts, peas, cauliflower, seaweed, and parsley. Other foods high in vitamin K include beef and pork liver, green tea, and soybean oil. Eat the same amount of foods high in vitamin K each day. Avoid big changes in your diet. Tell your doctor before changing your diet. Talk to a food specialist (dietitian) if you have questions.  Many medicines can cause problems with warfarin and affect your PT and INR. Tell your doctor about all medicines you take. This includes vitamins and dietary pills (supplements).  Do not take or stop taking any prescribed or over-the-counter medicines unless your doctor tells you to.  Warfarin can cause more bruising or bleeding. Hold pressure over any cuts for longer than normal. Talk to your doctor about other side effects of warfarin.  Avoid sports or activities that may cause injury or bleeding.  Be careful when you shave, floss, or use sharp objects.  Avoid or drink very little alcohol while taking warfarin. Tell your doctor if you change how much alcohol you drink.  Tell your dentist and other doctors that you take warfarin before any procedures.  Follow your diet program as told, if you are given one.  Keep a healthy weight.  Stay active. Try to get at least 30 minutes of activity on all or most days.  Do not use any tobacco products, including cigarettes, chewing tobacco, or electronic cigarettes. If you need help quitting, ask your doctor.  Limit alcohol intake to no more than 1 drink per day for nonpregnant women and 2 drinks per day for men. One drink equals 12 ounces of beer, 5 ounces of wine, or 1 ounces of hard liquor.  Do not abuse drugs.  Keep your home safe so you do not fall. You can do this by:  Putting grab bars in the bedroom and bathroom.  Raising toilet seats.  Putting a seat in the shower.  Keep all follow-up visits as told by your doctor. This is important. GET HELP IF:  Your personality changes.  You have trouble swallowing.  You have double vision.  You are dizzy.  You have a fever. GET HELP RIGHT AWAY IF:  These symptoms may be an emergency. Do not wait to see if the symptoms will go away. Get medical help right away. Call your local emergency services (911 in the [place] Do not drive yourself to the hospital.  You have sudden weakness or lose feeling (go numb), especially on one side of the body. This can affect your:  Face.  Arm.  Leg.  You have sudden trouble walking.  You have sudden trouble moving your  arms or legs.  You have sudden confusion.  You have trouble talking.  You have trouble understanding.  You have sudden trouble seeing in one or both eyes.  You lose your balance.  Your movements are not smooth.  You have a sudden, very bad headache with no known cause.  You have new chest pain.  Your heartbeat is unsteady.  You are partly or totally unaware of what is going on around you. MAKE SURE YOU:   Understand these instructions.  Will watch your condition.  Will get help right away if you are not doing well or get worse.   This information is not intended to replace advice given to you by your health care provider. Make sure you discuss any questions you have with your health care provider.   Document Released: 10/08/2007 Document Revised: 01/19/2014 Document Reviewed: 04/05/2013 Elsevier Interactive Patient Education [employer].

## 2015-06-06 NOTE — ED Provider Notes (Signed)
[facility] Emergency Department Provider Note   ____________________________________________  Time seen: Approximately 2:10 AM  I have reviewed the triage vital signs and the nursing notes.   HISTORY  Chief Complaint Headache    HPI Edgar Padilla is a 65 y.o. male who presents to the ED from home via EMS with a chief complaint ofheadache and TIA symptoms. Patient has a history of CAD, TIA, intracerebral hemorrhage during carotid endarterectomy requiring a 41 day stay at [facility]. He has very mild residual deficits from this which necessitates him ambulating with a walker. Both patient and wife report patient has frequent "spells" - describes TIA symptoms including aphasia, headache and extremity weakness. Patient was started on losartan one week ago. States he has had 5 such TIA-type episodes this week. Presents to the ED following his most recent one because these symptoms lasted longer than usual, approximately 20 minutes. Usual length of TIA symptoms is approximately 5 minutes. Denies recent fever, chills, chest pain, shortness of breath, abdominal pain, nausea, vomiting, diarrhea. Eyes recent travel or trauma. Nothing makes his symptoms better or worse.   Past Medical History  Diagnosis Date  . Coronary artery disease   . Stroke [facility])     TIA  . Hypertension   . CHF (congestive heart failure) ([place])   . Diabetes mellitus without complication ([place])   . Pulmonary fibrosis ([place])   . Pulmonary nodule   . Renal disorder   . Arthritis     Patient Active Problem List   Diagnosis Date Noted  . Carotid stenosis   . HLD (hyperlipidemia)   . Bladder outflow obstruction 11/22/2013  . Chronic kidney disease 11/22/2013  . Diabetic neuropathy ([place]) 11/22/2013  . Diabetic retinitis ([place]) 11/22/2013  . Gout 11/22/2013  . TIA (transient ischemic attack) 11/22/2013  . CVA (cerebral vascular accident) ([place]) 11/22/2013  . Essential (primary) hypertension 11/20/2013    . Sick sinus syndrome ([place]) 11/02/2013  . Biventricular automatic implantable cardioverter defibrillator in situ 10/11/2013  . Obstructive apnea 08/18/2013  . CCF (congestive cardiac failure) ([place]) 07/24/2013  . Lung mass 05/03/2013  . Body aches 04/30/2013  . Benign prostatic hyperplasia with urinary obstruction 04/30/2013  . Arteriosclerosis of coronary artery 04/30/2013  . Failure respiratory ([place]) 03/22/2013  . Cardiomyopathy, ischemic 03/14/2013  . History of myocardial infarct at age less than 55 years 01/20/2013  . Diabetes ([place]) 07/22/2012  . H/O coronary artery bypass surgery 07/22/2012  . 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 CA of skin 11/26/2011  Continuous oxygen use 4L  Past Surgical History  Procedure Laterality Date  . Carotid stent Left 11/12/2013  . Cardiac surgery    . Shoulder arthroscopy    . Back surgery    . Ep implantable device    . Coronary artery bypass graft    . Heart      Catherization    Current Outpatient Rx  Name  Route  Sig  Dispense  Refill  . aspirin EC 81 MG tablet   Oral   Take 81 mg by mouth daily.         . bumetanide (BUMEX) 2 MG tablet   Oral   Take 4 mg by mouth daily.          . carvedilol (COREG) 3.125 MG tablet   Oral   Take 3.125 mg by mouth 2 (two) times daily with a meal.         .  clopidogrel (PLAVIX) 75 MG tablet   Oral   Take 75 mg by mouth daily.         Marland Kitchen docusate sodium (COLACE) 100 MG capsule   Oral   Take 100 mg by mouth daily as needed for mild constipation.         . dutasteride (AVODART) 0.5 MG capsule   Oral   Take 0.5 mg by mouth daily.         Marland Kitchen escitalopram (LEXAPRO) 20 MG tablet   Oral   Take 20 mg by mouth daily.         Marland Kitchen gabapentin (NEURONTIN) 300 MG capsule   Oral   Take 300 mg by mouth 2 (two) times daily.         . insulin aspart (NOVOLOG) 100 UNIT/ML injection   Subcutaneous   Inject 0-40 Units into the skin  3 (three) times daily before meals. Per sliding scale         . insulin NPH Human (HUMULIN N,NOVOLIN N) 100 UNIT/ML injection   Subcutaneous   Inject 18 Units into the skin 2 (two) times daily.          Marland Kitchen losartan (COZAAR) 25 MG tablet   Oral   Take 25 mg by mouth daily.         . metolazone (ZAROXOLYN) 2.5 MG tablet   Oral   Take 2.5 mg by mouth daily as needed (fluid).         . nitroGLYCERIN (NITROSTAT) 0.4 MG SL tablet   Sublingual   Place 0.4 mg under the tongue every 5 (five) minutes as needed for chest pain.         Marland Kitchen omeprazole (PRILOSEC) 40 MG capsule   Oral   Take 40 mg by mouth daily.         . phenytoin (DILANTIN) 100 MG ER capsule   Oral   Take 200-300 mg by mouth 3 (three) times daily. 200mg  in the morning and 300mg  at night         . spironolactone (ALDACTONE) 25 MG tablet   Oral   Take 25 mg by mouth daily.         . cephALEXin (KEFLEX) 500 MG capsule   Oral   Take 1 capsule (500 mg total) by mouth 3 (three) times daily.   30 capsule   0     Allergies Ticagrelor; Restoril; and Statins  Family History  Problem Relation Age of Onset  . Heart failure Father   . Heart attack Father   . Rheum arthritis Mother     Social History Social History  Substance Use Topics  . Smoking status: Never Smoker   . Smokeless tobacco: None  . Alcohol Use: No    Review of Systems  Constitutional: No fever/chills. Eyes: No visual changes. ENT: No sore throat. Cardiovascular: Denies chest pain. Respiratory: Denies shortness of breath. Gastrointestinal: No abdominal pain.  No nausea, no vomiting.  No diarrhea.  No constipation. Genitourinary: Negative for dysuria. Musculoskeletal: Negative for back pain. Skin: Negative for rash. Neurological: Positive for headache, BLE weakness and aphasia.  10-point ROS otherwise negative.  ____________________________________________   PHYSICAL EXAM:  VITAL SIGNS: ED Triage Vitals  Enc Vitals Group      BP 06/06/15 0048 129/72 mmHg     Pulse Rate 06/06/15 0048 75     Resp 06/06/15 0048 11     Temp 06/06/15 0048 97.6 F (36.4 C)     Temp Source  06/06/15 0048 Oral     SpO2 06/06/15 0048 97 %     Weight 06/06/15 0048 214 lb (97.07 kg)     Height 06/06/15 0048 5\' 10"  (1.778 m)     Head Cir --      Peak Flow --      Pain Score 06/06/15 0048 5     Pain Loc --      Pain Edu? --      Excl. in [place]? --     Constitutional: Alert and oriented. Well appearing and in no acute distress. Eyes: Conjunctivae are normal. PERRL. EOMI. Head: Atraumatic. Nose: No congestion/rhinnorhea. Mouth/Throat: Mucous membranes are moist.  Oropharynx non-erythematous. Neck: No stridor.  No carotid bruit left side.  Cardiovascular: Normal rate, regular rhythm. Grossly normal heart sounds.  Good peripheral circulation. Respiratory: Normal respiratory effort.  No retractions. Lungs CTAB. Gastrointestinal: Soft and nontender. No distention. No abdominal bruits. No CVA tenderness. Musculoskeletal: No lower extremity tenderness nor edema.  No joint effusions. Neurologic:  Normal speech and language. No gross focal neurologic deficits are appreciated.  Skin:  Skin is warm, dry and intact. No rash noted. Psychiatric: Mood and affect are normal. Speech and behavior are normal.  ____________________________________________   LABS (all labs ordered are listed, but only abnormal results are displayed)  Labs Reviewed  CBC - Abnormal; Notable for the following:    RBC 3.61 (*)    Hemoglobin 11.4 (*)    HCT 33.5 (*)    All other components within normal limits  DIFFERENTIAL - Abnormal; Notable for the following:    Monocytes Absolute 1.2 (*)    All other components within normal limits  COMPREHENSIVE METABOLIC PANEL - Abnormal; Notable for the following:    BUN 55 (*)    Creatinine, Ser 2.03 (*)    Calcium 8.6 (*)    Alkaline Phosphatase 132 (*)    GFR calc non Af Amer 33 (*)    GFR calc Af Amer 38 (*)     All other components within normal limits  PROTIME-INR  APTT  TROPONIN I   ____________________________________________  EKG  ED ECG REPORT I, SUNG,JADE J, the attending physician, personally viewed and interpreted this ECG.   Date: 06/06/2015  EKG Time: 0045  Rate: 76  Rhythm: Paced rhythm  Axis: Paced  Intervals:none  ST&T Change: Paced  ____________________________________________  RADIOLOGY  CT head without contrast interpreted per Dr. Radene Knee: 1. No acute intracranial pathology seen on CT. 2. Mild cortical volume loss and scattered small vessel ischemic microangiopathy. ____________________________________________   PROCEDURES  Procedure(s) performed: None  Critical Care performed: No  ____________________________________________   INITIAL IMPRESSION / ASSESSMENT AND PLAN / ED COURSE  Pertinent labs & imaging results that were available during my care of the patient were reviewed by me and considered in my medical decision making (see chart for details).  65 year old male with CAD, TIA, prior hemorrhagic stroke who presents with increased "spells" of TIA like symptoms over the past week since starting losartan. Headache is resolved as is all TIA symptoms. Patient currently speaking with clear speech without focal neurological deficit. Will check screening lab work including troponin and obtain noncontrast CT head.  ----------------------------------------- 3:04 AM on 06/06/2015 -----------------------------------------  I had an extensive, greater than 30 minutes discussion with the patient and spouse. Updated them of laboratory and imaging results. Patient has a mild increase in his BUN and creatinine from last week (wife shows me his online medical chart demonstrating BUN was in the  40s with creatinine 1.9). This is most likely from the initiation of losartan. He has not had a recurrence of TIA symptoms nor headache. He has a supple neck without no focal  neurological deficits. I did offer hospitalization at our facility and did also offer transfer to [facility]; however, patient feels like he is back to his baseline and desires to go home. His wife is in frequent contact with his cardiologist by phone and states she will contact him first thing in the morning to discuss whether or not the patient should continue losartan. They are also in close contact with his neurologist at [facility] and will schedule a close follow-up appointment. They are both comfortable with discharge and no that they may return at any point should his symptoms recur, worsen, or if he has other concerns. Strict return precautions given. Both verbalize understanding and agree with plan of care. ____________________________________________   FINAL CLINICAL IMPRESSION(S) / ED DIAGNOSES  Final diagnoses:  Transient cerebral ischemia, unspecified transient cerebral ischemia type  Renal insufficiency      NEW MEDICATIONS STARTED DURING THIS VISIT:  Discharge Medication List as of 06/06/2015  3:01 AM       Note:  This document was prepared using Dragon voice recognition software and may include unintentional dictation errors.    Paulette Blanch, MD 06/06/15 [phone]

## 2015-06-06 NOTE — ED Notes (Addendum)
Pt to rm 14 via EMS from home.  EMS report pt's wife report TIA sx including aphasia, upon EMS arrival, aphasia resolved, pt c/o HA and leg weakness.  Pt report hx TIA and hemorrhagic stroke 1 year ago.  Pt reports HA has followed TIA in past but this time worse.  Pt rate HA 5/10, localized to top of head extending back to neck, described as aching.  No obvious deficits seen upon arrival.  Pt NAD at this time.    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  Wife reports residual sensory deficit to left side.    Per wife, pt on 4L o2 via [place] at home continuously b/c o2sat drop w/ very little activity

## 2015-06-06 NOTE — ED Notes (Signed)
Pt. Going home with family. 

## 2015-06-12 ENCOUNTER — Other Ambulatory Visit
Admission: RE | Admit: 2015-06-12 | Discharge: 2015-06-12 | Disposition: A | Discharge status: Home / Self Care | Payer: BC Managed Care – PPO | Source: Ambulatory Visit | Attending: Adult Health Nurse Practitioner | Admitting: Adult Health Nurse Practitioner

## 2015-06-12 DIAGNOSIS — I5022 Chronic systolic (congestive) heart failure: Secondary | ICD-10-CM | POA: Insufficient documentation

## 2015-06-12 LAB — BASIC METABOLIC PANEL
ANION GAP: 11 (ref 5–15)
BUN: 42 mg/dL — ABNORMAL HIGH (ref 6–20)
CALCIUM: 9 mg/dL (ref 8.9–10.3)
CHLORIDE: 103 mmol/L (ref 101–111)
CO2: 26 mmol/L (ref 22–32)
Creatinine, Ser: 1.83 mg/dL — ABNORMAL HIGH (ref 0.61–1.24)
GFR calc non Af Amer: 37 mL/min — ABNORMAL LOW (ref >60)
GFR, EST AFRICAN AMERICAN: 43 mL/min — AB (ref >60)
Glucose, Bld: 153 mg/dL — ABNORMAL HIGH (ref 65–99)
Potassium: 4 mmol/L (ref 3.5–5.1)
Sodium: 140 mmol/L (ref 135–145)

## 2015-10-09 ENCOUNTER — Inpatient Hospital Stay
Admission: EM | Admit: 2015-10-09 | Discharge: 2015-10-11 | DRG: 683 | Disposition: A | Discharge status: Home / Self Care | Payer: Medicare Other | Attending: Internal Medicine | Admitting: Internal Medicine

## 2015-10-09 ENCOUNTER — Encounter: Payer: Self-pay | Admitting: Emergency Medicine

## 2015-10-09 ENCOUNTER — Emergency Department: Payer: Medicare Other

## 2015-10-09 DIAGNOSIS — I252 Old myocardial infarction: Secondary | ICD-10-CM | POA: Diagnosis not present

## 2015-10-09 DIAGNOSIS — Z95810 Presence of automatic (implantable) cardiac defibrillator: Secondary | ICD-10-CM

## 2015-10-09 DIAGNOSIS — G40909 Epilepsy, unspecified, not intractable, without status epilepticus: Secondary | ICD-10-CM | POA: Diagnosis present

## 2015-10-09 DIAGNOSIS — Z9981 Dependence on supplemental oxygen: Secondary | ICD-10-CM

## 2015-10-09 DIAGNOSIS — Z7902 Long term (current) use of antithrombotics/antiplatelets: Secondary | ICD-10-CM

## 2015-10-09 DIAGNOSIS — M19031 Primary osteoarthritis, right wrist: Secondary | ICD-10-CM | POA: Diagnosis present

## 2015-10-09 DIAGNOSIS — Z7982 Long term (current) use of aspirin: Secondary | ICD-10-CM

## 2015-10-09 DIAGNOSIS — Z951 Presence of aortocoronary bypass graft: Secondary | ICD-10-CM

## 2015-10-09 DIAGNOSIS — Z23 Encounter for immunization: Secondary | ICD-10-CM | POA: Diagnosis not present

## 2015-10-09 DIAGNOSIS — R531 Weakness: Secondary | ICD-10-CM | POA: Diagnosis not present

## 2015-10-09 DIAGNOSIS — Z79899 Other long term (current) drug therapy: Secondary | ICD-10-CM

## 2015-10-09 DIAGNOSIS — Z955 Presence of coronary angioplasty implant and graft: Secondary | ICD-10-CM | POA: Diagnosis not present

## 2015-10-09 DIAGNOSIS — I130 Hypertensive heart and chronic kidney disease with heart failure and stage 1 through stage 4 chronic kidney disease, or unspecified chronic kidney disease: Secondary | ICD-10-CM | POA: Diagnosis present

## 2015-10-09 DIAGNOSIS — N289 Disorder of kidney and ureter, unspecified: Secondary | ICD-10-CM

## 2015-10-09 DIAGNOSIS — N179 Acute kidney failure, unspecified: Secondary | ICD-10-CM | POA: Diagnosis not present

## 2015-10-09 DIAGNOSIS — E860 Dehydration: Secondary | ICD-10-CM | POA: Diagnosis present

## 2015-10-09 DIAGNOSIS — R55 Syncope and collapse: Secondary | ICD-10-CM

## 2015-10-09 DIAGNOSIS — W19XXXA Unspecified fall, initial encounter: Secondary | ICD-10-CM | POA: Diagnosis present

## 2015-10-09 DIAGNOSIS — I255 Ischemic cardiomyopathy: Secondary | ICD-10-CM | POA: Diagnosis present

## 2015-10-09 DIAGNOSIS — Z8673 Personal history of transient ischemic attack (TIA), and cerebral infarction without residual deficits: Secondary | ICD-10-CM | POA: Diagnosis not present

## 2015-10-09 DIAGNOSIS — N189 Chronic kidney disease, unspecified: Secondary | ICD-10-CM | POA: Diagnosis present

## 2015-10-09 DIAGNOSIS — J8410 Pulmonary fibrosis, unspecified: Secondary | ICD-10-CM | POA: Diagnosis present

## 2015-10-09 DIAGNOSIS — I5022 Chronic systolic (congestive) heart failure: Secondary | ICD-10-CM | POA: Diagnosis present

## 2015-10-09 DIAGNOSIS — Z888 Allergy status to other drugs, medicaments and biological substances status: Secondary | ICD-10-CM

## 2015-10-09 DIAGNOSIS — R911 Solitary pulmonary nodule: Secondary | ICD-10-CM | POA: Diagnosis present

## 2015-10-09 DIAGNOSIS — E1140 Type 2 diabetes mellitus with diabetic neuropathy, unspecified: Secondary | ICD-10-CM | POA: Diagnosis present

## 2015-10-09 DIAGNOSIS — R339 Retention of urine, unspecified: Secondary | ICD-10-CM | POA: Diagnosis present

## 2015-10-09 DIAGNOSIS — Z66 Do not resuscitate: Secondary | ICD-10-CM | POA: Diagnosis present

## 2015-10-09 DIAGNOSIS — E1165 Type 2 diabetes mellitus with hyperglycemia: Secondary | ICD-10-CM | POA: Diagnosis present

## 2015-10-09 DIAGNOSIS — J9611 Chronic respiratory failure with hypoxia: Secondary | ICD-10-CM | POA: Diagnosis present

## 2015-10-09 DIAGNOSIS — I2510 Atherosclerotic heart disease of native coronary artery without angina pectoris: Secondary | ICD-10-CM | POA: Diagnosis present

## 2015-10-09 DIAGNOSIS — M109 Gout, unspecified: Secondary | ICD-10-CM | POA: Diagnosis present

## 2015-10-09 DIAGNOSIS — Z8249 Family history of ischemic heart disease and other diseases of the circulatory system: Secondary | ICD-10-CM

## 2015-10-09 DIAGNOSIS — I959 Hypotension, unspecified: Secondary | ICD-10-CM

## 2015-10-09 DIAGNOSIS — M6281 Muscle weakness (generalized): Secondary | ICD-10-CM

## 2015-10-09 DIAGNOSIS — Z794 Long term (current) use of insulin: Secondary | ICD-10-CM

## 2015-10-09 DIAGNOSIS — R52 Pain, unspecified: Secondary | ICD-10-CM

## 2015-10-09 DIAGNOSIS — M25561 Pain in right knee: Secondary | ICD-10-CM

## 2015-10-09 LAB — BLOOD GAS, VENOUS
ACID-BASE EXCESS: 4.7 mmol/L — AB (ref 0.0–2.0)
BICARBONATE: 30.8 mmol/L — AB (ref 20.0–28.0)
FIO2: 0.21
O2 Saturation: 91.3 %
PATIENT TEMPERATURE: 37
PO2 VEN: 63 mmHg — AB (ref 32.0–45.0)
pCO2, Ven: 52 mmHg (ref 44.0–60.0)
pH, Ven: 7.38 (ref 7.250–7.430)

## 2015-10-09 LAB — CBC
HEMATOCRIT: 31.9 % — AB (ref 40.0–52.0)
HEMOGLOBIN: 11.4 g/dL — AB (ref 13.0–18.0)
MCH: 32 pg (ref 26.0–34.0)
MCHC: 35.6 g/dL (ref 32.0–36.0)
MCV: 89.8 fL (ref 80.0–100.0)
PLATELETS: 255 10*3/uL (ref 150–440)
RBC: 3.55 MIL/uL — AB (ref 4.40–5.90)
RDW: 13.4 % (ref 11.5–14.5)
WBC: 8.2 10*3/uL (ref 3.8–10.6)

## 2015-10-09 LAB — URINALYSIS COMPLETE WITH MICROSCOPIC (ARMC ONLY)
BACTERIA UA: NONE SEEN
Bilirubin Urine: NEGATIVE
GLUCOSE, UA: NEGATIVE mg/dL
HGB URINE DIPSTICK: NEGATIVE
Ketones, ur: NEGATIVE mg/dL
Nitrite: NEGATIVE
PH: 5 (ref 5.0–8.0)
Protein, ur: NEGATIVE mg/dL
RBC / HPF: NONE SEEN RBC/hpf (ref 0–5)
SPECIFIC GRAVITY, URINE: 1.013 (ref 1.005–1.030)

## 2015-10-09 LAB — BASIC METABOLIC PANEL
ANION GAP: 14 (ref 5–15)
BUN: 75 mg/dL — ABNORMAL HIGH (ref 6–20)
CHLORIDE: 93 mmol/L — AB (ref 101–111)
CO2: 24 mmol/L (ref 22–32)
Calcium: 8.8 mg/dL — ABNORMAL LOW (ref 8.9–10.3)
Creatinine, Ser: 2.84 mg/dL — ABNORMAL HIGH (ref 0.61–1.24)
GFR calc non Af Amer: 22 mL/min — ABNORMAL LOW (ref >60)
GFR, EST AFRICAN AMERICAN: 25 mL/min — AB (ref >60)
Glucose, Bld: 371 mg/dL — ABNORMAL HIGH (ref 65–99)
POTASSIUM: 4.7 mmol/L (ref 3.5–5.1)
SODIUM: 131 mmol/L — AB (ref 135–145)

## 2015-10-09 LAB — CREATININE, SERUM
Creatinine, Ser: 2.58 mg/dL — ABNORMAL HIGH (ref 0.61–1.24)
GFR calc Af Amer: 28 mL/min — ABNORMAL LOW (ref >60)
GFR calc non Af Amer: 24 mL/min — ABNORMAL LOW (ref >60)

## 2015-10-09 LAB — BRAIN NATRIURETIC PEPTIDE: B Natriuretic Peptide: 75 pg/mL (ref 0.0–100.0)

## 2015-10-09 LAB — MAGNESIUM
Magnesium: 2 mg/dL (ref 1.7–2.4)
Magnesium: 2.1 mg/dL (ref 1.7–2.4)

## 2015-10-09 LAB — GLUCOSE, CAPILLARY
GLUCOSE-CAPILLARY: 141 mg/dL — AB (ref 65–99)
GLUCOSE-CAPILLARY: 218 mg/dL — AB (ref 65–99)
Glucose-Capillary: 98 mg/dL (ref 65–99)

## 2015-10-09 LAB — TROPONIN I

## 2015-10-09 MED ORDER — INSULIN ASPART 100 UNIT/ML ~~LOC~~ SOLN
0.0000 [IU] | Freq: Three times a day (TID) | SUBCUTANEOUS | Status: DC
  Administered 2015-10-10: 5 [IU] via SUBCUTANEOUS
  Administered 2015-10-10: 8 [IU] via SUBCUTANEOUS
  Administered 2015-10-10: 5 [IU] via SUBCUTANEOUS
  Administered 2015-10-11: 3 [IU] via SUBCUTANEOUS
  Administered 2015-10-11: 5 [IU] via SUBCUTANEOUS
  Filled 2015-10-09: qty 5
  Filled 2015-10-09: qty 1
  Filled 2015-10-09: qty 5
  Filled 2015-10-09: qty 2
  Filled 2015-10-09: qty 5
  Filled 2015-10-09: qty 8

## 2015-10-09 MED ORDER — PHENYTOIN SODIUM EXTENDED 100 MG PO CAPS
200.0000 mg | ORAL_CAPSULE | Freq: Every morning | ORAL | Status: DC
  Administered 2015-10-10 – 2015-10-11 (×2): 200 mg via ORAL
  Filled 2015-10-09 (×2): qty 2

## 2015-10-09 MED ORDER — DUTASTERIDE 0.5 MG PO CAPS
0.5000 mg | ORAL_CAPSULE | Freq: Every day | ORAL | Status: DC
  Filled 2015-10-09 (×3): qty 1

## 2015-10-09 MED ORDER — ONDANSETRON HCL 4 MG PO TABS: 4.0000 mg | ORAL_TABLET | Freq: Four times a day (QID) | ORAL | Status: DC | PRN

## 2015-10-09 MED ORDER — GABAPENTIN 300 MG PO CAPS
300.0000 mg | ORAL_CAPSULE | Freq: Every day | ORAL | Status: DC
  Administered 2015-10-09: 300 mg via ORAL
  Filled 2015-10-09: qty 1

## 2015-10-09 MED ORDER — ACETAMINOPHEN 325 MG PO TABS
650.0000 mg | ORAL_TABLET | Freq: Four times a day (QID) | ORAL | Status: DC | PRN
  Administered 2015-10-09: 650 mg via ORAL
  Filled 2015-10-09: qty 2

## 2015-10-09 MED ORDER — DIPHENHYDRAMINE HCL 25 MG PO CAPS
25.0000 mg | ORAL_CAPSULE | Freq: Three times a day (TID) | ORAL | Status: DC | PRN
  Administered 2015-10-09 – 2015-10-11 (×3): 25 mg via ORAL
  Filled 2015-10-09 (×3): qty 1

## 2015-10-09 MED ORDER — PANTOPRAZOLE SODIUM 40 MG PO TBEC
40.0000 mg | DELAYED_RELEASE_TABLET | Freq: Every day | ORAL | Status: DC
  Administered 2015-10-10 – 2015-10-11 (×2): 40 mg via ORAL
  Filled 2015-10-09 (×2): qty 1

## 2015-10-09 MED ORDER — GABAPENTIN 300 MG PO CAPS
300.0000 mg | ORAL_CAPSULE | Freq: Two times a day (BID) | ORAL | Status: DC
  Administered 2015-10-10 – 2015-10-11 (×3): 300 mg via ORAL
  Filled 2015-10-09 (×4): qty 1

## 2015-10-09 MED ORDER — ESCITALOPRAM OXALATE 10 MG PO TABS
20.0000 mg | ORAL_TABLET | Freq: Every day | ORAL | Status: DC
  Administered 2015-10-10 – 2015-10-11 (×2): 20 mg via ORAL
  Filled 2015-10-09 (×2): qty 2

## 2015-10-09 MED ORDER — SODIUM CHLORIDE 0.9 % IV SOLN
INTRAVENOUS | Status: DC
  Administered 2015-10-09: 20:00:00 via INTRAVENOUS
  Administered 2015-10-10: 1000 mL via INTRAVENOUS
  Administered 2015-10-10 – 2015-10-11 (×2): via INTRAVENOUS

## 2015-10-09 MED ORDER — INSULIN ASPART 100 UNIT/ML ~~LOC~~ SOLN
0.0000 [IU] | Freq: Every day | SUBCUTANEOUS | Status: DC
  Administered 2015-10-09 – 2015-10-10 (×2): 2 [IU] via SUBCUTANEOUS
  Filled 2015-10-09 (×2): qty 2

## 2015-10-09 MED ORDER — OXYCODONE-ACETAMINOPHEN 5-325 MG PO TABS
1.0000 | ORAL_TABLET | Freq: Four times a day (QID) | ORAL | Status: DC | PRN
  Administered 2015-10-10 (×2): 1 via ORAL
  Filled 2015-10-09 (×2): qty 1

## 2015-10-09 MED ORDER — ASPIRIN EC 81 MG PO TBEC
81.0000 mg | DELAYED_RELEASE_TABLET | Freq: Every day | ORAL | Status: DC
  Administered 2015-10-10 – 2015-10-11 (×2): 81 mg via ORAL
  Filled 2015-10-09 (×2): qty 1

## 2015-10-09 MED ORDER — ACETAMINOPHEN 650 MG RE SUPP: 650.0000 mg | Freq: Four times a day (QID) | RECTAL | Status: DC | PRN

## 2015-10-09 MED ORDER — CLOPIDOGREL BISULFATE 75 MG PO TABS
75.0000 mg | ORAL_TABLET | Freq: Every day | ORAL | Status: DC
  Administered 2015-10-10 – 2015-10-11 (×2): 75 mg via ORAL
  Filled 2015-10-09 (×2): qty 1

## 2015-10-09 MED ORDER — INSULIN ASPART 100 UNIT/ML ~~LOC~~ SOLN: 8.0000 [IU] | Freq: Once | SUBCUTANEOUS | Status: DC

## 2015-10-09 MED ORDER — INSULIN DETEMIR 100 UNIT/ML ~~LOC~~ SOLN
18.0000 [IU] | Freq: Two times a day (BID) | SUBCUTANEOUS | Status: DC
  Administered 2015-10-09 – 2015-10-11 (×4): 18 [IU] via SUBCUTANEOUS
  Filled 2015-10-09 (×5): qty 0.18

## 2015-10-09 MED ORDER — DOCUSATE SODIUM 100 MG PO CAPS: 100.0000 mg | ORAL_CAPSULE | Freq: Every day | ORAL | Status: DC | PRN

## 2015-10-09 MED ORDER — ALBUTEROL SULFATE (2.5 MG/3ML) 0.083% IN NEBU: 2.5000 mg | INHALATION_SOLUTION | RESPIRATORY_TRACT | Status: DC | PRN

## 2015-10-09 MED ORDER — NITROGLYCERIN 0.4 MG SL SUBL: 0.4000 mg | SUBLINGUAL_TABLET | SUBLINGUAL | Status: DC | PRN

## 2015-10-09 MED ORDER — ONDANSETRON HCL 4 MG/2ML IJ SOLN
4.0000 mg | Freq: Four times a day (QID) | INTRAMUSCULAR | Status: DC | PRN
  Administered 2015-10-10: 4 mg via INTRAVENOUS
  Filled 2015-10-09: qty 2

## 2015-10-09 MED ORDER — HEPARIN SODIUM (PORCINE) 5000 UNIT/ML IJ SOLN
5000.0000 [IU] | Freq: Three times a day (TID) | INTRAMUSCULAR | Status: DC
  Administered 2015-10-09 – 2015-10-11 (×5): 5000 [IU] via SUBCUTANEOUS
  Filled 2015-10-09 (×5): qty 1

## 2015-10-09 MED ORDER — SODIUM CHLORIDE 0.9 % IV SOLN
Freq: Once | INTRAVENOUS | Status: AC
  Administered 2015-10-09: 17:00:00 via INTRAVENOUS

## 2015-10-09 MED ORDER — TAMSULOSIN HCL 0.4 MG PO CAPS
0.4000 mg | ORAL_CAPSULE | ORAL | Status: DC
  Administered 2015-10-10 – 2015-10-11 (×2): 0.4 mg via ORAL
  Filled 2015-10-09 (×2): qty 1

## 2015-10-09 NOTE — ED Provider Notes (Signed)
[facility] Emergency Department Provider Note  Time seen: 4:01 PM  I have reviewed the triage vital signs and the nursing notes.   HISTORY  Chief Complaint Near Syncope and Fatigue    HPI Abdulhamid Uppal is a 65 y.o. male with a past medical history of diabetes, CHF, hypertension, pulmonary fibrosis, TIA, who presents the emergency department for generalized weakness/fatigue. According to the patient for the past one week he has been feeling progressively more short of breath with generalized fatigue/weakness. According to the patient and his wife today the patient stood to get out of the car, was too weak to stand and fell forward, the wife helped him to the ground. Patient denies hitting his head. Denies LOC. Patient states he feels very very weak, does not believe he could stand right now. Patient has significant CHF history, has 3 different cardiologist all at [facility].  Past Medical History:  Diagnosis Date  . Arthritis   . CHF (congestive heart failure) ([place])   . Coronary artery disease   . Diabetes mellitus without complication ([place])   . Hypertension   . Pulmonary fibrosis ([place])   . Pulmonary nodule   . Renal disorder   . Stroke [facility])    TIA    Patient Active Problem List   Diagnosis Date Noted  . Carotid stenosis   . HLD (hyperlipidemia)   . Bladder outflow obstruction 11/22/2013  . Chronic kidney disease 11/22/2013  . Diabetic neuropathy ([place]) 11/22/2013  . Diabetic retinitis ([place]) 11/22/2013  . Gout 11/22/2013  . TIA (transient ischemic attack) 11/22/2013  . CVA (cerebral vascular accident) ([place]) 11/22/2013  . Essential (primary) hypertension 11/20/2013  . Sick sinus syndrome ([place]) 11/02/2013  . Biventricular automatic implantable cardioverter defibrillator in situ 10/11/2013  . Obstructive apnea 08/18/2013  . CCF (congestive cardiac failure) ([place]) 07/24/2013  . Lung mass 05/03/2013  . Body aches 04/30/2013  . Benign prostatic hyperplasia  with urinary obstruction 04/30/2013  . Arteriosclerosis of coronary artery 04/30/2013  . Failure respiratory ([place]) 03/22/2013  . Cardiomyopathy, ischemic 03/14/2013  . History of myocardial infarct at age less than 70 years 01/20/2013  . Diabetes ([place]) 07/22/2012  . H/O coronary artery bypass surgery 07/22/2012  . 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 CA of skin 11/26/2011    Past Surgical History:  Procedure Laterality Date  . BACK SURGERY    . CARDIAC SURGERY    . CAROTID STENT Left 11/12/2013  . CORONARY ARTERY BYPASS GRAFT    . EP IMPLANTABLE DEVICE    . heart     Catherization  . SHOULDER ARTHROSCOPY      Prior to Admission medications   Medication Sig Start Date End Date Taking? Authorizing Provider  aspirin EC 81 MG tablet Take 81 mg by mouth daily.    Historical Provider, MD  bumetanide (BUMEX) 2 MG tablet Take 4 mg by mouth daily.     Historical Provider, MD  carvedilol (COREG) 3.125 MG tablet Take 3.125 mg by mouth 2 (two) times daily with a meal.    Historical Provider, MD  cephALEXin (KEFLEX) 500 MG capsule Take 1 capsule (500 mg total) by mouth 3 (three) times daily. 11/23/13   Archie Patten, MD  clopidogrel (PLAVIX) 75 MG tablet Take 75 mg by mouth daily.    Historical Provider, MD  docusate sodium (COLACE) 100 MG capsule Take 100 mg by mouth daily as needed for mild constipation.    Historical  Provider, MD  dutasteride (AVODART) 0.5 MG capsule Take 0.5 mg by mouth daily.    Historical Provider, MD  escitalopram (LEXAPRO) 20 MG tablet Take 20 mg by mouth daily.    Historical Provider, MD  gabapentin (NEURONTIN) 300 MG capsule Take 300 mg by mouth 2 (two) times daily.    Historical Provider, MD  insulin aspart (NOVOLOG) 100 UNIT/ML injection Inject 0-40 Units into the skin 3 (three) times daily before meals. Per sliding scale    Historical Provider, MD  insulin NPH Human (HUMULIN N,NOVOLIN N) 100 UNIT/ML  injection Inject 18 Units into the skin 2 (two) times daily.     Historical Provider, MD  losartan (COZAAR) 25 MG tablet Take 25 mg by mouth daily.    Historical Provider, MD  metolazone (ZAROXOLYN) 2.5 MG tablet Take 2.5 mg by mouth daily as needed (fluid).    Historical Provider, MD  nitroGLYCERIN (NITROSTAT) 0.4 MG SL tablet Place 0.4 mg under the tongue every 5 (five) minutes as needed for chest pain.    Historical Provider, MD  omeprazole (PRILOSEC) 40 MG capsule Take 40 mg by mouth daily.    Historical Provider, MD  phenytoin (DILANTIN) 100 MG ER capsule Take 200-300 mg by mouth 3 (three) times daily. 200mg  in the morning and 300mg  at night    Historical Provider, MD  spironolactone (ALDACTONE) 25 MG tablet Take 25 mg by mouth daily.    Historical Provider, MD    Allergies  Allergen Reactions  . Ticagrelor Shortness Of Breath    SOB  . Restoril [Temazepam] Other (See Comments)    resistant   . Statins Other (See Comments)    Myalgias, hypotension    Family History  Problem Relation Age of Onset  . Heart failure Father   . Heart attack Father   . Rheum arthritis Mother     Social History Social History  Substance Use Topics  . Smoking status: Never Smoker  . Smokeless tobacco: Never Used  . Alcohol use No    Review of Systems Constitutional: Negative for fever. Cardiovascular: Negative for chest pain. Respiratory: Negative for shortness of breath. Gastrointestinal: Negative for abdominal pain Musculoskeletal: Negative for back pain. Neurological: Negative for headache 10-point ROS otherwise negative.  ____________________________________________   PHYSICAL EXAM:  VITAL SIGNS: ED Triage Vitals  Enc Vitals Group     BP 10/09/15 1332 (!) 122/55     Pulse Rate 10/09/15 1332 66     Resp 10/09/15 1332 12     Temp --      Temp Source 10/09/15 1332 Oral     SpO2 10/09/15 1332 96 %     Weight 10/09/15 1336 214 lb (97.1 kg)     Height 10/09/15 1336 5\' 10"  (1.778  m)     Head Circumference --      Peak Flow --      Pain Score 10/09/15 1337 2     Pain Loc --      Pain Edu? --      Excl. in [place]? --     Constitutional: Alert and oriented. He keeps his eyes closed for much of the exam, appears fatigued Eyes: Normal exam ENT   Head: Normocephalic and atraumatic   Mouth/Throat: Dry mucous membranes. Cardiovascular: Normal rate, regular rhythm. No murmur Respiratory: Normal respiratory effort without tachypnea nor retractions. Breath sounds are clear  Gastrointestinal: Soft and nontender. No distention.  Musculoskeletal: Nontender with normal range of motion in all extremities. No lower extremity edema.  Neurologic:  Normal speech and language. No gross focal neurologic deficits Skin:  Skin is warm, dry and intact.  Psychiatric: Mood and affect are normal.   ____________________________________________    EKG  EKG reviewed and interpreted by myself shows an atrial ventricular dual paced rhythm at 68 bpm, somewhat widened QRS with left axis deviation, nonspecific ST changes, no clear ST elevation.  ____________________________________________    RADIOLOGY  Chest x-ray shows no acute abnormality  ____________________________________________   INITIAL IMPRESSION / ASSESSMENT AND PLAN / ED COURSE  Pertinent labs & imaging results that were available during my care of the patient were reviewed by me and considered in my medical decision making (see chart for details).  The patient presents the emergency department generalized weakness. Patient has significant history of CHF. Patient's labs are resulted, troponins within normal limits, creatinine is increased to 2.8 from a baseline of 1.8. Glucose is elevated in the 300s within an anion gap of 14. We'll obtain a VBG. We will begin gentle IV hydration and I'll discuss the patient with his cardiologist Dr.Chien at [facility].  Troponin is negative. Discussed the patient with Dr. Lurline Hare at [facility], who agrees the patient appears dry and will require IV hydration at a slow rate given his history of CHF. We'll admit for IV hydration and continued monitoring and treatment of his generalized weakness with acute on chronic renal insufficiency.  ____________________________________________   FINAL CLINICAL IMPRESSION(S) / ED DIAGNOSES  Generalized weakness Acute on chronic renal insufficiency   Harvest Dark, MD 10/09/15 1626

## 2015-10-09 NOTE — ED Notes (Signed)
Pt c/o sudden CP to left side under pacemaker, describes pain as pins and needles sensation. VS stable , MD notified
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
## 2015-10-09 NOTE — ED Triage Notes (Signed)
Pt to ED via EMS from home, pt had syncopal episode after c/o increased weakness x few days. Pt has hx of stroke and MI. Pt has vent pacemaker. Pt denies fever or n/v.Per EMS  Pt glucose is 464, pt took 4U of insulin at 1300. Pt is on 4L o2 at home chronically. Pt A&O

## 2015-10-09 NOTE — Progress Notes (Signed)
Received pt from ED to Rm 205. Pt AOx4. VSS. No signs of acute distress. IV access intact and and IVF running. Medications administered (See MAR). Admission completed. Assessment completed. DVT prophylaxis assessed and completed.   Education provided on call bell, bed alarm, telephone and IV/IV Pole/IV Alarms. 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 White Board was completed and updated PRN.   Dietary specification confirmed.   Wife at bedside.  Wife states that she will bring in DNR form and Advanced Directive.  Foley catheter inserted (See Flowsheet)  Pt resting comfortably and quietly w/bed in low and locked position and call bell and telephone within reach. Will continue to monitor.

## 2015-10-09 NOTE — ED Notes (Signed)
cbg 141

## 2015-10-10 ENCOUNTER — Inpatient Hospital Stay: Payer: Medicare Other

## 2015-10-10 LAB — CBC
HCT: 30.5 % — ABNORMAL LOW (ref 40.0–52.0)
Hemoglobin: 10.7 g/dL — ABNORMAL LOW (ref 13.0–18.0)
MCH: 31.6 pg (ref 26.0–34.0)
MCHC: 35.1 g/dL (ref 32.0–36.0)
MCV: 90.1 fL (ref 80.0–100.0)
PLATELETS: 211 10*3/uL (ref 150–440)
RBC: 3.39 MIL/uL — ABNORMAL LOW (ref 4.40–5.90)
RDW: 13.3 % (ref 11.5–14.5)
WBC: 6.7 10*3/uL (ref 3.8–10.6)

## 2015-10-10 LAB — BASIC METABOLIC PANEL
Anion gap: 8 (ref 5–15)
BUN: 64 mg/dL — AB (ref 6–20)
CO2: 28 mmol/L (ref 22–32)
CREATININE: 2.17 mg/dL — AB (ref 0.61–1.24)
Calcium: 8.3 mg/dL — ABNORMAL LOW (ref 8.9–10.3)
Chloride: 99 mmol/L — ABNORMAL LOW (ref 101–111)
GFR calc Af Amer: 35 mL/min — ABNORMAL LOW (ref >60)
GFR, EST NON AFRICAN AMERICAN: 30 mL/min — AB (ref >60)
Glucose, Bld: 226 mg/dL — ABNORMAL HIGH (ref 65–99)
Potassium: 4.3 mmol/L (ref 3.5–5.1)
SODIUM: 135 mmol/L (ref 135–145)

## 2015-10-10 LAB — GLUCOSE, CAPILLARY
GLUCOSE-CAPILLARY: 236 mg/dL — AB (ref 65–99)
Glucose-Capillary: 231 mg/dL — ABNORMAL HIGH (ref 65–99)
Glucose-Capillary: 287 mg/dL — ABNORMAL HIGH (ref 65–99)
Glucose-Capillary: 209 mg/dL — ABNORMAL HIGH (ref 65–99)

## 2015-10-10 LAB — PHENYTOIN LEVEL, TOTAL: PHENYTOIN LVL: 4.5 ug/mL — AB (ref 10.0–20.0)

## 2015-10-10 LAB — ALBUMIN: ALBUMIN: 3.8 g/dL (ref 3.5–5.0)

## 2015-10-10 MED ORDER — PHENYTOIN SODIUM EXTENDED 100 MG PO CAPS
300.0000 mg | ORAL_CAPSULE | Freq: Every day | ORAL | Status: DC
  Administered 2015-10-10: 300 mg via ORAL
  Filled 2015-10-10: qty 3

## 2015-10-10 MED ORDER — DICLOFENAC SODIUM 1 % TD GEL
2.0000 g | Freq: Four times a day (QID) | TRANSDERMAL | Status: DC
  Administered 2015-10-10 (×2): 2 g via TOPICAL
  Filled 2015-10-10: qty 100

## 2015-10-10 MED ORDER — OXYCODONE-ACETAMINOPHEN 5-325 MG PO TABS
1.0000 | ORAL_TABLET | Freq: Four times a day (QID) | ORAL | Status: DC | PRN
  Administered 2015-10-10 – 2015-10-11 (×4): 2 via ORAL
  Filled 2015-10-10 (×5): qty 2

## 2015-10-10 MED ORDER — PNEUMOCOCCAL VAC POLYVALENT 25 MCG/0.5ML IJ INJ
0.5000 mL | INJECTION | INTRAMUSCULAR | Status: AC
  Administered 2015-10-11: 0.5 mL via INTRAMUSCULAR
  Filled 2015-10-10: qty 0.5

## 2015-10-10 NOTE — Progress Notes (Signed)
Chaplain was making rounds on the floor visiting new patients when the nursed informed that the patient might need spiritual support. Chaplain visited the patient who appeared to be tired but awake. Patient asked for prayers for healing, family and friends which the chaplain offered.    10/10/15 1500  Clinical Encounter Type  Visited With Patient  Visit Type Spiritual support  Consult/Referral To Nurse  Spiritual Encounters  Spiritual Needs Prayer

## 2015-10-10 NOTE — Progress Notes (Addendum)
Initial Nutrition Assessment  DOCUMENTATION CODES:   Not applicable  INTERVENTION:  -Monitor intake and cater to pt preferences -If unable to meet nutritional needs recommend adding glucerna BID for added nutrition   NUTRITION DIAGNOSIS:   Inadequate oral intake related to acute illness as evidenced by per patient/family report.    GOAL:   Patient will meet greater than or equal to 90% of their needs    MONITOR:   PO intake  REASON FOR ASSESSMENT:   Malnutrition Screening Tool    ASSESSMENT:      65 y.o. Male admitted with near syncope, weakness, acute on chronic renal failure.   Pt with history of HTN, DM, CHF, CAD, chronic respiratory failure, pulmonary fibrosis and CVA.  Pt reports elevated blood glucose when at home.   Pt reports appetite has been decreased for the last week. Reports ate 1/2 steak and potato before coming into hospital.  Ate pancakes and bacon for breakfast this am. Pt reports hand hurts and just has not felt well over the past week which has effected intake.  Noted per I and O sheet intake 100%  Medications reviewed: aspart, determir, protonix, NS at 75ml/hr Labs reviewed: BUN 64, creatinine 2.17, glucose 226  UOP: 2056ml   Nutrition-Focused physical exam completed. Findings are WDL for fat depletion, muscle depletion, and edema.    Diet Order:  Diet Carb Modified Fluid consistency: Thin; Room service appropriate? Yes  Skin:  Reviewed, no issues  Last BM:  9/26  Height:   Ht Readings from Last 1 Encounters:  10/09/15 5\' 10"  (1.778 m)    Weight: Pt reports UBW of 222 pounds.  Noted wt in 06/06/15 of 214 pounds, current wt 214 pounds.  Wt Readings from Last 1 Encounters:  10/09/15 214 lb (97.1 kg)    Ideal Body Weight:     BMI:  Body mass index is 30.71 kg/m.  Estimated Nutritional Needs:   Kcal:  1700-2200 kcals/d  Protein:  116-145 g/d  Fluid:  >/= 1.7 L/d  EDUCATION NEEDS:   No education needs identified at this  time  Caedence Snowden B. Zenia Resides, [place], [place] (pager) Weekend/On-Call pager [phone])
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
## 2015-10-11 ENCOUNTER — Inpatient Hospital Stay: Payer: Medicare Other

## 2015-10-11 DIAGNOSIS — J9611 Chronic respiratory failure with hypoxia: Secondary | ICD-10-CM

## 2015-10-11 DIAGNOSIS — N189 Chronic kidney disease, unspecified: Secondary | ICD-10-CM

## 2015-10-11 DIAGNOSIS — I5022 Chronic systolic (congestive) heart failure: Secondary | ICD-10-CM

## 2015-10-11 DIAGNOSIS — M25561 Pain in right knee: Secondary | ICD-10-CM

## 2015-10-11 DIAGNOSIS — G40909 Epilepsy, unspecified, not intractable, without status epilepticus: Secondary | ICD-10-CM

## 2015-10-11 DIAGNOSIS — I959 Hypotension, unspecified: Secondary | ICD-10-CM

## 2015-10-11 DIAGNOSIS — M109 Gout, unspecified: Secondary | ICD-10-CM

## 2015-10-11 DIAGNOSIS — N179 Acute kidney failure, unspecified: Secondary | ICD-10-CM

## 2015-10-11 DIAGNOSIS — R55 Syncope and collapse: Secondary | ICD-10-CM

## 2015-10-11 DIAGNOSIS — E860 Dehydration: Secondary | ICD-10-CM

## 2015-10-11 LAB — HEMOGLOBIN A1C
HEMOGLOBIN A1C: 7.9 % — AB (ref 4.8–5.6)
MEAN PLASMA GLUCOSE: 180 mg/dL

## 2015-10-11 LAB — BASIC METABOLIC PANEL
Anion gap: 6 (ref 5–15)
BUN: 49 mg/dL — AB (ref 6–20)
CHLORIDE: 104 mmol/L (ref 101–111)
CO2: 28 mmol/L (ref 22–32)
Calcium: 8.1 mg/dL — ABNORMAL LOW (ref 8.9–10.3)
Creatinine, Ser: 1.56 mg/dL — ABNORMAL HIGH (ref 0.61–1.24)
GFR calc Af Amer: 52 mL/min — ABNORMAL LOW (ref >60)
GFR, EST NON AFRICAN AMERICAN: 45 mL/min — AB (ref >60)
GLUCOSE: 179 mg/dL — AB (ref 65–99)
POTASSIUM: 4.3 mmol/L (ref 3.5–5.1)
Sodium: 138 mmol/L (ref 135–145)

## 2015-10-11 LAB — GLUCOSE, CAPILLARY
GLUCOSE-CAPILLARY: 213 mg/dL — AB (ref 65–99)
Glucose-Capillary: 188 mg/dL — ABNORMAL HIGH (ref 65–99)

## 2015-10-11 LAB — URIC ACID: Uric Acid, Serum: 11.3 mg/dL — ABNORMAL HIGH (ref 4.4–7.6)

## 2015-10-11 MED ORDER — DICLOFENAC SODIUM 1 % TD GEL: 2.0000 g | Freq: Four times a day (QID) | TRANSDERMAL | 5 refills | Status: DC

## 2015-10-11 MED ORDER — PREDNISONE 10 MG (21) PO TBPK: 10.0000 mg | ORAL_TABLET | Freq: Every day | ORAL | 0 refills | Status: DC

## 2015-10-11 MED ORDER — BUMETANIDE 2 MG PO TABS: 4.0000 mg | ORAL_TABLET | Freq: Every day | ORAL | 5 refills | Status: DC

## 2015-10-11 MED ORDER — METHYLPREDNISOLONE SODIUM SUCC 40 MG IJ SOLR
40.0000 mg | Freq: Once | INTRAMUSCULAR | Status: AC
  Administered 2015-10-11: 40 mg via INTRAVENOUS
  Filled 2015-10-11: qty 1

## 2015-10-11 MED ORDER — INSULIN NPH (HUMAN) (ISOPHANE) 100 UNIT/ML ~~LOC~~ SUSP: 20.0000 [IU] | Freq: Two times a day (BID) | SUBCUTANEOUS | 11 refills | Status: AC

## 2015-10-11 NOTE — Progress Notes (Signed)
Inpatient Diabetes Program Recommendations  AACE/ADA: New Consensus Statement on Inpatient Glycemic Control (2015)  Target Ranges:  Prepandial:   less than 140 mg/dL      Peak postprandial:   less than 180 mg/dL (1-2 hours)      Critically ill patients:  140 - 180 mg/dL  Results for Edgar Padilla, Edgar Padilla (MRN [id number]) as of 10/11/2015 08:57  Ref. Range 10/10/2015 07:54 10/10/2015 12:30 10/10/2015 16:39 10/10/2015 21:18 10/11/2015 07:39  Glucose-Capillary Latest Ref Range: 65 - 99 mg/dL 231 (H) 287 (H) 236 (H) 209 (H) 188 (H)   Results for Edgar Padilla, Edgar Padilla (MRN [id number]) as of 10/11/2015 08:57  Ref. Range 10/10/2015 05:24 10/11/2015 04:03  Hemoglobin A1C Latest Ref Range: 4.8 - 5.6 % 7.9 (H)   Glucose Latest Ref Range: 65 - 99 mg/dL 226 (H) 179 (H)   Review of Glycemic Control  Diabetes history: DM2 Outpatient Diabetes medications: NPH 18 units BID, Humalog TID with meals (for correction and carb coverage) Current orders for Inpatient glycemic control: Novolog 0-15 units TID with meals, Novolog 0-5 units QHS, Levemir 18 units BID  Inpatient Diabetes Program Recommendations: Insulin - Basal: Please consider increasing Levemir to 20 units BID. Insulin - Meal Coverage: Post prandial glucose is consistently elevated. Please consider ordering Novolog 5 units TID with meals for meal coverage.  Thanks, Barnie Alderman, RN, MSN, CDE Diabetes Coordinator Inpatient Diabetes Program [phone] (Team Pager from [place] to [place]) [phone] ([facility] office) [phone] [facility] office) [phone] [facility] office)

## 2015-10-11 NOTE — Progress Notes (Signed)
Pt d/c to home today.  Foley removed per order.  IV removed intact.  Rx's given to pt w/all questions and concerns addressed.  D/C paperwork reviewed and education provided with all questions and concerns addressed.  Pt wife here for transport.

## 2015-10-11 NOTE — Evaluation (Signed)
Physical Therapy Evaluation Patient Details Name: Edgar Padilla MRN: [id number] DOB: July 10, 1950 Today's Date: 10/11/2015   History of Present Illness  65 y/o male here with renal failure.  He has long term O2 needs (4 liters 24/7).  Clinical Impression  Pt did well with PT despite initially being unsure of how he would do.  He ended up circumambulating the nurses' station X2 with walker and 4 liters O2 and generally did well, had some safety concerns or significant changes in mental status.  He did have some balance issues w/o AD/UE support but reports mobility-wise to be near baseline.     Follow Up Recommendations No PT follow up    Equipment Recommendations       Recommendations for Other Services       Precautions / Restrictions Precautions Precautions: Fall Restrictions Weight Bearing Restrictions: No      Mobility  Bed Mobility Overal bed mobility: Modified Independent             General bed mobility comments: Pt able to get to EOB e/o direct assist, UE use limited secondary to wrist pain  Transfers Overall transfer level: Modified independent Equipment used: Rolling walker (2 wheeled)             General transfer comment: Pt attempted to stand w/o AD, was unable to maintain balance more than a few seconds without UE assist.  Ambulation/Gait Ambulation/Gait assistance: Supervision Ambulation Distance (Feet): 350 Feet Assistive device: Rolling walker (2 wheeled)       General Gait Details: Pt able to walk with consistent and relatively confident cadence.  He shows only minimal fatigue and his O2 remained in the mid/high 90s and HR stayed 70s-80s the entire time  Stairs            Wheelchair Mobility    Modified Rankin (Stroke Patients Only)       Balance Overall balance assessment: Modified Independent (with AD, unable to balance w/o UE assist)                                           Pertinent Vitals/Pain Pain  Assessment: 0-10 Pain Score: 4  Pain Location: b/l wrists, R>L    Home Living Family/patient expects to be discharged to:: Private residence Living Arrangements: Spouse/significant other     Home Access: Stairs to enter       Home Equipment: Environmental consultant - 4 wheels (O2)      Prior Function Level of Independence: Independent with assistive device(s)         Comments: Pt reports that he occasionally gets out of the house, but does not generally do a lot of walking, etc     Hand Dominance        Extremity/Trunk Assessment   Upper Extremity Assessment: Generalized weakness (b/l grip limited, able to elevate shoulders overhead b/l)           Lower Extremity Assessment: Overall WFL for tasks assessed (grossly functional, generally 4/5 t/o)         Communication   Communication: No difficulties  Cognition Arousal/Alertness: Awake/alert Behavior During Therapy: WFL for tasks assessed/performed Overall Cognitive Status: Within Functional Limits for tasks assessed                      General Comments      Exercises     Assessment/Plan  PT Assessment Patent does not need any further PT services  PT Problem List            PT Treatment Interventions      PT Goals (Current goals can be found in the Care Plan section)  Acute Rehab PT Goals Patient Stated Goal: go home    Frequency     Barriers to discharge        Co-evaluation               End of Session Equipment Utilized During Treatment: Gait belt Activity Tolerance: Patient tolerated treatment well Patient left: with chair alarm set;with call bell/phone within reach Nurse Communication: Mobility status         Time: [id number] PT Time Calculation (min) (ACUTE ONLY): 27 min   Charges:   PT Evaluation $PT Eval Low Complexity: 1 Procedure     PT G CodesKreg Shropshire, DPT 10/11/2015, 9:57 AM
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
## 2016-04-03 ENCOUNTER — Emergency Department
Admission: EM | Admit: 2016-04-03 | Discharge: 2016-04-03 | Disposition: A | Discharge status: Other Acute Inpatient Hospital | Payer: Medicare Other | Attending: Emergency Medicine | Admitting: Emergency Medicine

## 2016-04-03 ENCOUNTER — Emergency Department: Payer: Medicare Other

## 2016-04-03 ENCOUNTER — Ambulatory Visit (HOSPITAL_COMMUNITY)
Admission: AD | Admit: 2016-04-03 | Discharge: 2016-04-03 | Disposition: A | Discharge status: Home / Self Care | Payer: Medicare Other | Source: Other Acute Inpatient Hospital | Attending: Emergency Medicine | Admitting: Emergency Medicine

## 2016-04-03 DIAGNOSIS — M1000 Idiopathic gout, unspecified site: Secondary | ICD-10-CM | POA: Diagnosis not present

## 2016-04-03 DIAGNOSIS — E1122 Type 2 diabetes mellitus with diabetic chronic kidney disease: Secondary | ICD-10-CM | POA: Insufficient documentation

## 2016-04-03 DIAGNOSIS — R079 Chest pain, unspecified: Secondary | ICD-10-CM | POA: Diagnosis present

## 2016-04-03 DIAGNOSIS — I5043 Acute on chronic combined systolic (congestive) and diastolic (congestive) heart failure: Secondary | ICD-10-CM | POA: Diagnosis not present

## 2016-04-03 DIAGNOSIS — Z85828 Personal history of other malignant neoplasm of skin: Secondary | ICD-10-CM | POA: Diagnosis not present

## 2016-04-03 DIAGNOSIS — Z79899 Other long term (current) drug therapy: Secondary | ICD-10-CM | POA: Diagnosis not present

## 2016-04-03 DIAGNOSIS — N189 Chronic kidney disease, unspecified: Secondary | ICD-10-CM | POA: Diagnosis not present

## 2016-04-03 DIAGNOSIS — Z7982 Long term (current) use of aspirin: Secondary | ICD-10-CM | POA: Insufficient documentation

## 2016-04-03 DIAGNOSIS — I2510 Atherosclerotic heart disease of native coronary artery without angina pectoris: Secondary | ICD-10-CM | POA: Diagnosis not present

## 2016-04-03 DIAGNOSIS — R0602 Shortness of breath: Secondary | ICD-10-CM | POA: Diagnosis present

## 2016-04-03 DIAGNOSIS — Z794 Long term (current) use of insulin: Secondary | ICD-10-CM | POA: Insufficient documentation

## 2016-04-03 DIAGNOSIS — Z951 Presence of aortocoronary bypass graft: Secondary | ICD-10-CM | POA: Insufficient documentation

## 2016-04-03 DIAGNOSIS — N179 Acute kidney failure, unspecified: Secondary | ICD-10-CM

## 2016-04-03 DIAGNOSIS — M109 Gout, unspecified: Secondary | ICD-10-CM

## 2016-04-03 DIAGNOSIS — I130 Hypertensive heart and chronic kidney disease with heart failure and stage 1 through stage 4 chronic kidney disease, or unspecified chronic kidney disease: Secondary | ICD-10-CM | POA: Insufficient documentation

## 2016-04-03 LAB — COMPREHENSIVE METABOLIC PANEL
ALBUMIN: 3.7 g/dL (ref 3.5–5.0)
ALK PHOS: 96 U/L (ref 38–126)
ALT: 19 U/L (ref 17–63)
ANION GAP: 13 (ref 5–15)
AST: 21 U/L (ref 15–41)
BUN: 98 mg/dL — AB (ref 6–20)
CO2: 22 mmol/L (ref 22–32)
Calcium: 8 mg/dL — ABNORMAL LOW (ref 8.9–10.3)
Chloride: 98 mmol/L — ABNORMAL LOW (ref 101–111)
Creatinine, Ser: 3.2 mg/dL — ABNORMAL HIGH (ref 0.61–1.24)
GFR calc Af Amer: 22 mL/min — ABNORMAL LOW (ref >60)
GFR calc non Af Amer: 19 mL/min — ABNORMAL LOW (ref >60)
GLUCOSE: 82 mg/dL (ref 65–99)
POTASSIUM: 4.6 mmol/L (ref 3.5–5.1)
SODIUM: 133 mmol/L — AB (ref 135–145)
Total Bilirubin: 0.5 mg/dL (ref 0.3–1.2)
Total Protein: 7.4 g/dL (ref 6.5–8.1)

## 2016-04-03 LAB — CBC WITH DIFFERENTIAL/PLATELET
Basophils Absolute: 0.1 10*3/uL (ref 0–0.1)
Basophils Relative: 1 %
EOS ABS: 0.4 10*3/uL (ref 0–0.7)
Eosinophils Relative: 3 %
HCT: 26.3 % — ABNORMAL LOW (ref 40.0–52.0)
HEMOGLOBIN: 9.2 g/dL — AB (ref 13.0–18.0)
LYMPHS ABS: 0.9 10*3/uL — AB (ref 1.0–3.6)
Lymphocytes Relative: 7 %
MCH: 32.2 pg (ref 26.0–34.0)
MCHC: 35.1 g/dL (ref 32.0–36.0)
MCV: 91.6 fL (ref 80.0–100.0)
MONOS PCT: 14 %
Monocytes Absolute: 1.7 10*3/uL — ABNORMAL HIGH (ref 0.2–1.0)
NEUTROS PCT: 75 %
Neutro Abs: 9 10*3/uL — ABNORMAL HIGH (ref 1.4–6.5)
Platelets: 318 10*3/uL (ref 150–440)
RBC: 2.87 MIL/uL — ABNORMAL LOW (ref 4.40–5.90)
RDW: 13.3 % (ref 11.5–14.5)
WBC: 12.1 10*3/uL — AB (ref 3.8–10.6)

## 2016-04-03 LAB — INFLUENZA PANEL BY PCR (TYPE A & B)
Influenza A By PCR: NEGATIVE
Influenza B By PCR: NEGATIVE

## 2016-04-03 LAB — LACTIC ACID, PLASMA: Lactic Acid, Venous: 1.4 mmol/L (ref 0.5–1.9)

## 2016-04-03 LAB — TROPONIN I: Troponin I: <0.03 ng/mL (ref <0.03)

## 2016-04-03 LAB — BRAIN NATRIURETIC PEPTIDE: B Natriuretic Peptide: 223 pg/mL — ABNORMAL HIGH (ref 0.0–100.0)

## 2016-04-03 LAB — URIC ACID: Uric Acid, Serum: 13 mg/dL — ABNORMAL HIGH (ref 4.4–7.6)

## 2016-04-03 LAB — LIPASE, BLOOD: Lipase: 11 U/L (ref 11–51)

## 2016-04-03 MED ORDER — MORPHINE SULFATE (PF) 4 MG/ML IV SOLN
4.0000 mg | Freq: Once | INTRAVENOUS | Status: AC
  Administered 2016-04-03: 4 mg via INTRAVENOUS
  Filled 2016-04-03: qty 1

## 2016-04-03 MED ORDER — ONDANSETRON HCL 4 MG/2ML IJ SOLN
4.0000 mg | INTRAMUSCULAR | Status: AC
  Administered 2016-04-03: 4 mg via INTRAVENOUS
  Filled 2016-04-03: qty 2

## 2016-04-03 NOTE — ED Provider Notes (Signed)
[facility] Emergency Department Provider Note  ____________________________________________   First MD Initiated Contact with Patient 04/03/16 0139     (approximate)  I have reviewed the triage vital signs and the nursing notes.   HISTORY  Chief Complaint Chest Pain    HPI Edgar Padilla is a 66 y.o. male with a very complicated history that includes congestive heart failure and possible pulmonary fibrosis who uses 4 L of oxygen by nasal cannula at all times and has ventricular pacer and an implanted device to transmit cardiac pressures to his cardiologists at [facility](all of his providers are at [facility]).  He presents by private vehicle for evaluation of gradually worsening shortness of breath, frothy productive cough, nausea, chest pain, and general malaise over the last 24 hours.  In spite of using extra doses of Bumex his lower extremity swelling and shortness of breath has been getting worse.  It is much worse when he lies down flat and slightly better when he sits up.  He has increased work of breathing and is having intercostal retractions and accessory muscle usage.  Nothing is making it better.  His wife reports that his cardiologists have contacted him because his implanted devices are sending them data and they can tell that he is getting worse.   Past Medical History:  Diagnosis Date  . Arthritis   . CHF (congestive heart failure) ([place])   . Coronary artery disease   . Diabetes mellitus without complication ([place])   . Hypertension   . Pulmonary fibrosis ([place])   . Pulmonary nodule   . Renal disorder   . Stroke [facility])    TIA    Patient Active Problem List   Diagnosis Date Noted  . Near syncope 10/11/2015  . Acute on chronic renal failure ([place]) 10/11/2015  . Dehydration 10/11/2015  . Hypotension 10/11/2015  . Seizure disorder ([place]) 10/11/2015  . Right knee pain 10/11/2015  . Chronic systolic CHF (congestive heart failure) ([place]) 10/11/2015  . Chronic  respiratory failure with hypoxia ([place]) 10/11/2015  . Gout attack 10/11/2015  . Renal failure (ARF), acute on chronic (HCC) 10/09/2015  . Carotid stenosis   . HLD (hyperlipidemia)   . Bladder outflow obstruction 11/22/2013  . Chronic kidney disease 11/22/2013  . Diabetic neuropathy ([place]) 11/22/2013  . Diabetic retinitis ([place]) 11/22/2013  . Gout 11/22/2013  . TIA (transient ischemic attack) 11/22/2013  . CVA (cerebral vascular accident) ([place]) 11/22/2013  . Essential (primary) hypertension 11/20/2013  . Sick sinus syndrome ([place]) 11/02/2013  . Biventricular automatic implantable cardioverter defibrillator in situ 10/11/2013  . Obstructive apnea 08/18/2013  . CCF (congestive cardiac failure) ([place]) 07/24/2013  . Lung mass 05/03/2013  . Body aches 04/30/2013  . Benign prostatic hyperplasia with urinary obstruction 04/30/2013  . Arteriosclerosis of coronary artery 04/30/2013  . Failure respiratory ([place]) 03/22/2013  . Cardiomyopathy, ischemic 03/14/2013  . History of myocardial infarct at age less than 71 years 01/20/2013  . Diabetes ([place]) 07/22/2012  . H/O coronary artery bypass surgery 07/22/2012  . 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 CA of skin 11/26/2011    Past Surgical History:  Procedure Laterality Date  . BACK SURGERY    . CARDIAC SURGERY    . CAROTID STENT Left 11/12/2013  . CORONARY ARTERY BYPASS GRAFT    . EP IMPLANTABLE DEVICE    . heart     Catherization  . SHOULDER ARTHROSCOPY      Prior to Admission medications  Medication Sig Start Date End Date Taking? Authorizing Provider  acetaminophen (TYLENOL) 500 MG tablet Take 1,500 mg by mouth at bedtime.   Yes Historical Provider, MD  aspirin EC 81 MG tablet Take 81 mg by mouth every morning.    Yes Historical Provider, MD  bumetanide (BUMEX) 2 MG tablet Take 2 tablets (4 mg total) by mouth daily. 10/11/15  Yes Theodoro Grist, MD  carvedilol (COREG) 6.25 MG tablet Take  6.25 mg by mouth 2 (two) times daily with a meal.   Yes Historical Provider, MD  clopidogrel (PLAVIX) 75 MG tablet Take 75 mg by mouth daily.   Yes Historical Provider, MD  escitalopram (LEXAPRO) 20 MG tablet Take 20 mg by mouth every morning.    Yes Historical Provider, MD  finasteride (PROSCAR) 5 MG tablet Take 5 mg by mouth daily.   Yes Historical Provider, MD  gabapentin (NEURONTIN) 300 MG capsule Take 300 mg by mouth 2 (two) times daily.   Yes Historical Provider, MD  insulin lispro (HUMALOG) 100 UNIT/ML injection Inject 1-160 Units into the skin 3 (three) times daily before meals. *Sliding scale and carb count*   Yes Historical Provider, MD  insulin NPH Human (HUMULIN N,NOVOLIN N) 100 UNIT/ML injection Inject 0.2 mLs (20 Units total) into the skin 2 (two) times daily. *Patient uses Humulin N* 10/11/15  Yes Theodoro Grist, MD  losartan (COZAAR) 50 MG tablet Take 50 mg by mouth daily.   Yes Historical Provider, MD  nitroGLYCERIN (NITROSTAT) 0.4 MG SL tablet Place 0.4 mg under the tongue every 5 (five) minutes as needed for chest pain.   Yes Historical Provider, MD  omeprazole (PRILOSEC) 40 MG capsule Take 40 mg by mouth every morning.    Yes Historical Provider, MD  oxyCODONE-acetaminophen (PERCOCET/ROXICET) 5-325 MG tablet Take 1 tablet by mouth See admin instructions. Take 1 tablet by mouth every 6 to 8 hours as needed for pain. 09/18/15  Yes Historical Provider, MD  phenytoin (DILANTIN) 100 MG ER capsule Take 200-300 mg by mouth See admin instructions. Take 2 capsules (200 mg) by mouth in the morning and Take 3 capsules (300 mg) by mouth every night at bedtime.   Yes Historical Provider, MD  spironolactone (ALDACTONE) 25 MG tablet Take 25 mg by mouth every morning.    Yes Historical Provider, MD  tamsulosin (FLOMAX) 0.4 MG CAPS capsule Take 0.4 mg by mouth every morning.   Yes Historical Provider, MD  docusate sodium (COLACE) 100 MG capsule Take 100 mg by mouth daily as needed for mild constipation.     Historical Provider, MD  predniSONE (STERAPRED UNI-PAK 21 TAB) 10 MG (21) TBPK tablet Take 1 tablet (10 mg total) by mouth daily. Please take 6 pills in the morning on the day one, then taper by 1 pill daily until finished, thank you Patient not taking: Reported on 04/03/2016 10/11/15   Theodoro Grist, MD    Allergies Ticagrelor; Lisinopril; Restoril [temazepam]; and Statins  Family History  Problem Relation Age of Onset  . Heart failure Father   . Heart attack Father   . Rheum arthritis Mother     Social History Social History  Substance Use Topics  . Smoking status: Never Smoker  . Smokeless tobacco: Never Used  . Alcohol use No    Review of Systems Constitutional: No fever/chills Eyes: No visual changes. ENT: No sore throat. Cardiovascular: +chest pain. Respiratory: +shortness of breath, cough productive of frothy sputum Gastrointestinal: No abdominal pain.  nausea, no vomiting.  No diarrhea.  No constipation. Genitourinary: Negative for dysuria. Musculoskeletal: Negative for back pain. Skin: Negative for rash. Neurological: Negative for headaches, focal weakness or numbness.  10-point ROS otherwise negative.  ____________________________________________   PHYSICAL EXAM:  VITAL SIGNS: ED Triage Vitals [04/03/16 0119]  Enc Vitals Group     BP 122/62     Pulse Rate 80     Resp 18     Temp 97.6 F (36.4 C)     Temp Source Oral     SpO2 95 %     Weight 225 lb (102.1 kg)     Height 5\' 10"  (1.778 m)     Head Circumference      Peak Flow      Pain Score 9     Pain Loc      Pain Edu?      Excl. in [place]?     Constitutional: Alert and oriented. Moderate respiratory distress.  Chronic ill appearance Eyes: Conjunctivae are normal. PERRL. EOMI. Head: Atraumatic. Nose: No congestion/rhinnorhea. Mouth/Throat: Mucous membranes are moist. Neck: No stridor.  No meningeal signs.   Cardiovascular: Normal rate, regular rhythm. Good peripheral circulation. Grossly  normal heart sounds. Respiratory: Maryjean Morn work of breathing with accessory muscle usage and intercostal retractions.  Coarse breath sounds in bilateral lung bases.  Frequent cough productive of frothy sputum.  Speaks in short phrases. Gastrointestinal: Soft and nontender. No distention.  Musculoskeletal: No lower extremity tenderness nor edema. No gross deformities of extremities. Neurologic:  Normal speech and language. No gross focal neurologic deficits are appreciated.  Skin:  Skin is warm, dry and intact. No rash noted. Psychiatric: Mood and affect are normal. Speech and behavior are normal.  ____________________________________________   LABS (all labs ordered are listed, but only abnormal results are displayed)  Labs Reviewed  COMPREHENSIVE METABOLIC PANEL - Abnormal; Notable for the following:       Result Value   Sodium 133 (*)    Chloride 98 (*)    BUN 98 (*)    Creatinine, Ser 3.20 (*)    Calcium 8.0 (*)    GFR calc non Af Amer 19 (*)    GFR calc Af Amer 22 (*)    All other components within normal limits  BRAIN NATRIURETIC PEPTIDE - Abnormal; Notable for the following:    B Natriuretic Peptide 223.0 (*)    All other components within normal limits  CBC WITH DIFFERENTIAL/PLATELET - Abnormal; Notable for the following:    WBC 12.1 (*)    RBC 2.87 (*)    Hemoglobin 9.2 (*)    HCT 26.3 (*)    Neutro Abs 9.0 (*)    Lymphs Abs 0.9 (*)    Monocytes Absolute 1.7 (*)    All other components within normal limits  URIC ACID - Abnormal; Notable for the following:    Uric Acid, Serum 13.0 (*)    All other components within normal limits  LIPASE, BLOOD  TROPONIN I  LACTIC ACID, PLASMA  INFLUENZA PANEL BY PCR (TYPE A & B)  LACTIC ACID, PLASMA   ____________________________________________  EKG  ED ECG REPORT I, Mykela Mewborn, the attending physician, personally viewed and interpreted this ECG.  Date: 04/03/2016 EKG Time: 1:27 AM Rate: 79 Rhythm: Ventricular paced  rhythm QRS Axis: Left axis deviation Intervals: Paced rhythm ST/T Wave abnormalities: Non-specific ST segment / T-wave changes, but no evidence of acute ischemia. Conduction Disturbances: none Narrative Interpretation: unremarkable  ____________________________________________  RADIOLOGY   Dg Chest 2 View  Result Date:  04/03/2016 CLINICAL DATA:  Acute onset of generalized chest pain, shortness of breath, nausea and cough. Initial encounter. EXAM: CHEST  2 VIEW COMPARISON:  Chest radiograph performed 10/09/2015 FINDINGS: The lungs are well-aerated. Mild vascular congestion is noted. Increased interstitial markings may reflect mild interstitial edema. There is no evidence of pleural effusion or pneumothorax. The heart is mildly enlarged. The patient is status post median sternotomy. A pacemaker/AICD is noted at the left chest wall, with leads ending at the right atrium, right ventricle and coronary sinus. No acute osseous abnormalities are seen. IMPRESSION: Mild vascular congestion and mild cardiomegaly. Increased interstitial markings may reflect mild interstitial edema. Electronically Signed   By: Garald Balding M.D.   On: 04/03/2016 02:17    ____________________________________________   PROCEDURES  Critical Care performed: No   Procedure(s) performed:   Procedures   ____________________________________________   INITIAL IMPRESSION / ASSESSMENT AND PLAN / ED COURSE  Pertinent labs & imaging results that were available during my care of the patient were reviewed by me and considered in my medical decision making (see chart for details).  Numerous chronic medical problems and the patient is having some moderate respiratory distress.  I am checking broad lab work and 2 view chest x-ray and then will initiate BiPAP.  I told the patient that he knows his body better than I do and asked him if he needs BiPAP and he said yes so we will obtain a as we get the imaging.  Of note his  vital signs are completely normal and on 4 L of oxygen, which is his baseline, he is satting in the upper 90s to 100%.   Clinical Course as of Apr 03 433  Fri Apr 03, 2016  0237 I ordered Bipap for the patient, but when RT evaluated him, he reportedly is breathing comfortably and does not want the bipap.  Canceling the order.  [CF]  0404 The patient is breathing comfortably now.  I believe that anxiety and acute on chronic pain was playing a role in his difficulty breathing.  However, his labs are concerning for acute on chronic renal failure with a creatinine of 3.2 (baseline is apparently around 2, although the prior values ICA and CHL and care everywhere are lower).    I discussed the plan of care with the patient, and he and his wife want him to go to [facility].  I think this is appropriate given that all his providers are located at that facility.  I am calling the transfer center now.  Given that he has signs and symptoms of acute on chronic heart failure in addition to the acute on chronic renal failure, I will hold off on both fluids and diuretics at this time.  [CF]  [phone] Spoke by phone with Dr. Tonye Royalty, [facility].  He accepted the patient for transfer to be transported by [facility] given [place] [employer].  Patient stable at this time.  Dr. Jewel Baize asked that I not initiate any treatment (neither fluids nor diuretics) given the delicate balance between the two.  Updated patient and spouse.  [CF]    Clinical Course User Index [CF] Hinda Kehr, MD    ____________________________________________  FINAL CLINICAL IMPRESSION(S) / ED DIAGNOSES  Final diagnoses:  Acute on chronic combined systolic and diastolic congestive heart failure ([place])  Acute renal failure superimposed on chronic kidney disease, unspecified CKD stage, unspecified acute renal failure type ([place])  Acute gout of multiple sites,  unspecified cause     MEDICATIONS GIVEN DURING THIS  VISIT:  Medications  ondansetron (ZOFRAN) injection 4 mg (4 mg Intravenous Given 04/03/16 0246)  morphine 4 MG/ML injection 4 mg (4 mg Intravenous Given 04/03/16 0327)     NEW OUTPATIENT MEDICATIONS STARTED DURING THIS VISIT:  New Prescriptions   No medications on file    Modified Medications   No medications on file    Discontinued Medications   DICLOFENAC SODIUM (VOLTAREN) 1 % GEL    Apply 2 g topically 4 (four) times daily.   LOSARTAN (COZAAR) 25 MG TABLET    Take 25 mg by mouth at bedtime.    METOLAZONE (ZAROXOLYN) 2.5 MG TABLET    Take 2.5 mg by mouth daily as needed (fluid).     Note:  This document was prepared using Dragon voice recognition software and may include unintentional dictation errors.    Hinda Kehr, MD 04/03/16 [phone]

## 2016-04-03 NOTE — ED Triage Notes (Signed)
Pt in with co chest pain, shob, nausea and cough since 2100. States feels like CHF hx of the same.

## 2016-04-03 NOTE — ED Notes (Addendum)
Pt called this RN using the call bell; when this RN went to the room and asked the pt what was going on, pt stated "I have a tingling." This RN asked the pt where the tingling was, pt pointed to his neck area; pt was asked if the tingling was painful, making it difficult for the pt to breathe, difficult to swallow, or causing any other concern; pt responded "no, it's just bugging me." Pt was informed that the MD will be notified, and will evaluate after that. MD notified, no further orders received.

## 2016-05-18 ENCOUNTER — Observation Stay
Admission: EM | Admit: 2016-05-18 | Discharge: 2016-05-19 | Disposition: A | Discharge status: Home / Self Care | Payer: Medicare Other | Attending: Internal Medicine | Admitting: Internal Medicine

## 2016-05-18 ENCOUNTER — Emergency Department: Payer: Medicare Other

## 2016-05-18 DIAGNOSIS — Z950 Presence of cardiac pacemaker: Secondary | ICD-10-CM | POA: Insufficient documentation

## 2016-05-18 DIAGNOSIS — I5022 Chronic systolic (congestive) heart failure: Secondary | ICD-10-CM | POA: Insufficient documentation

## 2016-05-18 DIAGNOSIS — R55 Syncope and collapse: Secondary | ICD-10-CM | POA: Diagnosis not present

## 2016-05-18 DIAGNOSIS — Z9981 Dependence on supplemental oxygen: Secondary | ICD-10-CM | POA: Insufficient documentation

## 2016-05-18 DIAGNOSIS — N400 Enlarged prostate without lower urinary tract symptoms: Secondary | ICD-10-CM | POA: Diagnosis not present

## 2016-05-18 DIAGNOSIS — Z951 Presence of aortocoronary bypass graft: Secondary | ICD-10-CM | POA: Insufficient documentation

## 2016-05-18 DIAGNOSIS — E1122 Type 2 diabetes mellitus with diabetic chronic kidney disease: Secondary | ICD-10-CM | POA: Insufficient documentation

## 2016-05-18 DIAGNOSIS — R42 Dizziness and giddiness: Secondary | ICD-10-CM

## 2016-05-18 DIAGNOSIS — N183 Chronic kidney disease, stage 3 (moderate): Secondary | ICD-10-CM | POA: Insufficient documentation

## 2016-05-18 DIAGNOSIS — I252 Old myocardial infarction: Secondary | ICD-10-CM | POA: Insufficient documentation

## 2016-05-18 DIAGNOSIS — E1140 Type 2 diabetes mellitus with diabetic neuropathy, unspecified: Secondary | ICD-10-CM | POA: Insufficient documentation

## 2016-05-18 DIAGNOSIS — Z794 Long term (current) use of insulin: Secondary | ICD-10-CM | POA: Insufficient documentation

## 2016-05-18 DIAGNOSIS — N179 Acute kidney failure, unspecified: Secondary | ICD-10-CM | POA: Insufficient documentation

## 2016-05-18 DIAGNOSIS — I2510 Atherosclerotic heart disease of native coronary artery without angina pectoris: Secondary | ICD-10-CM | POA: Insufficient documentation

## 2016-05-18 DIAGNOSIS — Z79899 Other long term (current) drug therapy: Secondary | ICD-10-CM | POA: Insufficient documentation

## 2016-05-18 DIAGNOSIS — Z7982 Long term (current) use of aspirin: Secondary | ICD-10-CM | POA: Insufficient documentation

## 2016-05-18 DIAGNOSIS — Z66 Do not resuscitate: Secondary | ICD-10-CM | POA: Insufficient documentation

## 2016-05-18 DIAGNOSIS — Z7902 Long term (current) use of antithrombotics/antiplatelets: Secondary | ICD-10-CM | POA: Insufficient documentation

## 2016-05-18 DIAGNOSIS — R61 Generalized hyperhidrosis: Secondary | ICD-10-CM

## 2016-05-18 DIAGNOSIS — Z8673 Personal history of transient ischemic attack (TIA), and cerebral infarction without residual deficits: Secondary | ICD-10-CM | POA: Insufficient documentation

## 2016-05-18 DIAGNOSIS — J9611 Chronic respiratory failure with hypoxia: Secondary | ICD-10-CM | POA: Insufficient documentation

## 2016-05-18 DIAGNOSIS — I130 Hypertensive heart and chronic kidney disease with heart failure and stage 1 through stage 4 chronic kidney disease, or unspecified chronic kidney disease: Secondary | ICD-10-CM | POA: Diagnosis not present

## 2016-05-18 HISTORY — DX: Acute myocardial infarction, unspecified: I21.9

## 2016-05-18 LAB — CBC
HCT: 32.4 % — ABNORMAL LOW (ref 40.0–52.0)
Hemoglobin: 11.1 g/dL — ABNORMAL LOW (ref 13.0–18.0)
MCH: 30.9 pg (ref 26.0–34.0)
MCHC: 34.3 g/dL (ref 32.0–36.0)
MCV: 90 fL (ref 80.0–100.0)
Platelets: 263 10*3/uL (ref 150–440)
RBC: 3.6 MIL/uL — AB (ref 4.40–5.90)
RDW: 13.7 % (ref 11.5–14.5)
WBC: 10.7 10*3/uL — AB (ref 3.8–10.6)

## 2016-05-18 LAB — BASIC METABOLIC PANEL
ANION GAP: 12 (ref 5–15)
BUN: 75 mg/dL — ABNORMAL HIGH (ref 6–20)
CALCIUM: 8.3 mg/dL — AB (ref 8.9–10.3)
CO2: 31 mmol/L (ref 22–32)
Chloride: 94 mmol/L — ABNORMAL LOW (ref 101–111)
Creatinine, Ser: 2.83 mg/dL — ABNORMAL HIGH (ref 0.61–1.24)
GFR calc Af Amer: 25 mL/min — ABNORMAL LOW (ref >60)
GFR, EST NON AFRICAN AMERICAN: 22 mL/min — AB (ref >60)
GLUCOSE: 155 mg/dL — AB (ref 65–99)
POTASSIUM: 3.4 mmol/L — AB (ref 3.5–5.1)
SODIUM: 137 mmol/L (ref 135–145)

## 2016-05-18 LAB — TROPONIN I

## 2016-05-18 LAB — URINALYSIS, COMPLETE (UACMP) WITH MICROSCOPIC
BACTERIA UA: NONE SEEN
Bilirubin Urine: NEGATIVE
GLUCOSE, UA: 50 mg/dL — AB
Hgb urine dipstick: NEGATIVE
Ketones, ur: NEGATIVE mg/dL
LEUKOCYTES UA: NEGATIVE
NITRITE: NEGATIVE
PH: 6 (ref 5.0–8.0)
Protein, ur: NEGATIVE mg/dL
RBC / HPF: NONE SEEN RBC/hpf (ref 0–5)
SPECIFIC GRAVITY, URINE: 1.008 (ref 1.005–1.030)

## 2016-05-18 LAB — MAGNESIUM: MAGNESIUM: 1.8 mg/dL (ref 1.7–2.4)

## 2016-05-18 LAB — GLUCOSE, CAPILLARY
GLUCOSE-CAPILLARY: 307 mg/dL — AB (ref 65–99)
Glucose-Capillary: 268 mg/dL — ABNORMAL HIGH (ref 65–99)
Glucose-Capillary: 288 mg/dL — ABNORMAL HIGH (ref 65–99)

## 2016-05-18 MED ORDER — CARVEDILOL 12.5 MG PO TABS
12.5000 mg | ORAL_TABLET | Freq: Two times a day (BID) | ORAL | Status: DC
  Administered 2016-05-18 – 2016-05-19 (×3): 12.5 mg via ORAL
  Filled 2016-05-18 (×3): qty 1

## 2016-05-18 MED ORDER — CLOPIDOGREL BISULFATE 75 MG PO TABS
75.0000 mg | ORAL_TABLET | Freq: Every day | ORAL | Status: DC
  Administered 2016-05-18 – 2016-05-19 (×2): 75 mg via ORAL
  Filled 2016-05-18 (×2): qty 1

## 2016-05-18 MED ORDER — ALBUTEROL SULFATE (2.5 MG/3ML) 0.083% IN NEBU: 2.5000 mg | INHALATION_SOLUTION | RESPIRATORY_TRACT | Status: DC | PRN

## 2016-05-18 MED ORDER — ESCITALOPRAM OXALATE 10 MG PO TABS
20.0000 mg | ORAL_TABLET | ORAL | Status: DC
  Administered 2016-05-18 – 2016-05-19 (×2): 20 mg via ORAL
  Filled 2016-05-18 (×2): qty 2

## 2016-05-18 MED ORDER — INSULIN DETEMIR 100 UNIT/ML ~~LOC~~ SOLN
20.0000 [IU] | Freq: Two times a day (BID) | SUBCUTANEOUS | Status: DC
  Administered 2016-05-18 – 2016-05-19 (×3): 20 [IU] via SUBCUTANEOUS
  Filled 2016-05-18 (×4): qty 0.2

## 2016-05-18 MED ORDER — HYDROCODONE-ACETAMINOPHEN 5-325 MG PO TABS
1.0000 | ORAL_TABLET | ORAL | Status: DC | PRN
Start: 2016-05-18 — End: 2016-05-19

## 2016-05-18 MED ORDER — NITROGLYCERIN 0.4 MG SL SUBL: 0.4000 mg | SUBLINGUAL_TABLET | SUBLINGUAL | Status: DC | PRN

## 2016-05-18 MED ORDER — ONDANSETRON HCL 4 MG/2ML IJ SOLN
4.0000 mg | Freq: Once | INTRAMUSCULAR | Status: AC
  Administered 2016-05-18: 4 mg via INTRAVENOUS
  Filled 2016-05-18: qty 2

## 2016-05-18 MED ORDER — LOSARTAN POTASSIUM 25 MG PO TABS
25.0000 mg | ORAL_TABLET | Freq: Every day | ORAL | Status: DC
  Administered 2016-05-18 – 2016-05-19 (×2): 25 mg via ORAL
  Filled 2016-05-18 (×2): qty 1

## 2016-05-18 MED ORDER — POTASSIUM CHLORIDE IN NACL 20-0.9 MEQ/L-% IV SOLN
INTRAVENOUS | Status: AC
  Administered 2016-05-18: 10:00:00 via INTRAVENOUS
  Filled 2016-05-18: qty 1000

## 2016-05-18 MED ORDER — INSULIN ASPART 100 UNIT/ML ~~LOC~~ SOLN
0.0000 [IU] | Freq: Every day | SUBCUTANEOUS | Status: DC
  Administered 2016-05-18: 3 [IU] via SUBCUTANEOUS
  Filled 2016-05-18: qty 3

## 2016-05-18 MED ORDER — HEPARIN SODIUM (PORCINE) 5000 UNIT/ML IJ SOLN
5000.0000 [IU] | Freq: Three times a day (TID) | INTRAMUSCULAR | Status: DC
  Administered 2016-05-18 (×2): 5000 [IU] via SUBCUTANEOUS
  Filled 2016-05-18 (×2): qty 1

## 2016-05-18 MED ORDER — POLYETHYLENE GLYCOL 3350 17 G PO PACK: 17.0000 g | PACK | Freq: Every day | ORAL | Status: DC | PRN

## 2016-05-18 MED ORDER — ONDANSETRON HCL 4 MG/2ML IJ SOLN
4.0000 mg | Freq: Four times a day (QID) | INTRAMUSCULAR | Status: DC | PRN
Start: 2016-05-18 — End: 2016-05-19

## 2016-05-18 MED ORDER — CYCLOBENZAPRINE HCL 10 MG PO TABS: 10.0000 mg | ORAL_TABLET | Freq: Two times a day (BID) | ORAL | Status: DC | PRN

## 2016-05-18 MED ORDER — POTASSIUM CHLORIDE CRYS ER 20 MEQ PO TBCR
40.0000 meq | EXTENDED_RELEASE_TABLET | Freq: Once | ORAL | Status: AC
  Administered 2016-05-18: 40 meq via ORAL
  Filled 2016-05-18: qty 2

## 2016-05-18 MED ORDER — ACETAMINOPHEN 325 MG PO TABS
650.0000 mg | ORAL_TABLET | Freq: Four times a day (QID) | ORAL | Status: DC | PRN
  Administered 2016-05-18 – 2016-05-19 (×2): 650 mg via ORAL
  Filled 2016-05-18 (×3): qty 2

## 2016-05-18 MED ORDER — INSULIN ASPART 100 UNIT/ML ~~LOC~~ SOLN
0.0000 [IU] | Freq: Three times a day (TID) | SUBCUTANEOUS | Status: DC
  Administered 2016-05-18: 7 [IU] via SUBCUTANEOUS
  Administered 2016-05-18: 5 [IU] via SUBCUTANEOUS
  Administered 2016-05-19: 3 [IU] via SUBCUTANEOUS
  Filled 2016-05-18: qty 7
  Filled 2016-05-18: qty 5
  Filled 2016-05-18: qty 3

## 2016-05-18 MED ORDER — INSULIN NPH (HUMAN) (ISOPHANE) 100 UNIT/ML ~~LOC~~ SUSP: 20.0000 [IU] | Freq: Two times a day (BID) | SUBCUTANEOUS | Status: DC

## 2016-05-18 MED ORDER — ONDANSETRON HCL 4 MG PO TABS: 4.0000 mg | ORAL_TABLET | Freq: Four times a day (QID) | ORAL | Status: DC | PRN

## 2016-05-18 MED ORDER — PANTOPRAZOLE SODIUM 40 MG PO TBEC
40.0000 mg | DELAYED_RELEASE_TABLET | Freq: Every day | ORAL | Status: DC
  Administered 2016-05-18 – 2016-05-19 (×2): 40 mg via ORAL
  Filled 2016-05-18 (×2): qty 1

## 2016-05-18 MED ORDER — SODIUM CHLORIDE 0.9 % IV BOLUS (SEPSIS)
250.0000 mL | Freq: Once | INTRAVENOUS | Status: AC
  Administered 2016-05-18: 250 mL via INTRAVENOUS

## 2016-05-18 MED ORDER — FINASTERIDE 5 MG PO TABS
5.0000 mg | ORAL_TABLET | Freq: Every day | ORAL | Status: DC
  Administered 2016-05-18: 5 mg via ORAL
  Filled 2016-05-18: qty 1

## 2016-05-18 MED ORDER — GABAPENTIN 300 MG PO CAPS
300.0000 mg | ORAL_CAPSULE | Freq: Three times a day (TID) | ORAL | Status: DC
  Administered 2016-05-18 – 2016-05-19 (×4): 300 mg via ORAL
  Filled 2016-05-18 (×4): qty 1

## 2016-05-18 MED ORDER — OXYCODONE-ACETAMINOPHEN 5-325 MG PO TABS
1.0000 | ORAL_TABLET | Freq: Four times a day (QID) | ORAL | Status: DC | PRN
  Administered 2016-05-18: 1 via ORAL
  Filled 2016-05-18: qty 1

## 2016-05-18 MED ORDER — TAMSULOSIN HCL 0.4 MG PO CAPS
0.4000 mg | ORAL_CAPSULE | ORAL | Status: DC
  Administered 2016-05-18 – 2016-05-19 (×2): 0.4 mg via ORAL
  Filled 2016-05-18 (×2): qty 1

## 2016-05-18 MED ORDER — ASPIRIN EC 81 MG PO TBEC
81.0000 mg | DELAYED_RELEASE_TABLET | ORAL | Status: DC
  Administered 2016-05-18 – 2016-05-19 (×2): 81 mg via ORAL
  Filled 2016-05-18 (×2): qty 1

## 2016-05-18 MED ORDER — ACETAMINOPHEN 650 MG RE SUPP: 650.0000 mg | Freq: Four times a day (QID) | RECTAL | Status: DC | PRN

## 2016-05-18 NOTE — ED Notes (Signed)
Patient's wife reports patient fell twice. She heard fall from the other room. She came to room, patient fell again and hit his head on shower wall. Patient's wife reports he complained of nausea, became cold, clammy, and diaphoretic immediately prior to second syncopal episode.

## 2016-05-18 NOTE — ED Notes (Signed)
Pt transported to floor by Bill, rn. Wife aware of admission.

## 2016-05-18 NOTE — Progress Notes (Signed)
Performed urinary straight catheterization at this time. See flowsheet data for output volume. Patient didn't want to perform himself due to the floor "not having the same equipment as I do at home". Edgar Padilla [facility]

## 2016-05-18 NOTE — ED Notes (Signed)
Patient transported to CT 

## 2016-05-18 NOTE — ED Notes (Signed)
Patient reports he has been dizzy for several days. Pt further reports that he self caths 4 times per day

## 2016-05-18 NOTE — Progress Notes (Signed)
Nutrition Brief Note  Patient identified on the Malnutrition Screening Tool (MST) Report  Wt Readings from Last 15 Encounters:  05/18/16 220 lb 8 oz (100 kg)  04/03/16 225 lb (102.1 kg)  10/09/15 214 lb (97.1 kg)  06/06/15 214 lb (97.1 kg)  11/22/13 200 lb (90.7 kg)    Body mass index is 31.64 kg/m. Patient meets criteria for obese (class I) based on current BMI.    Denies any issues with appetite or weight loss. Current diet order is carb mod, patient is consuming approximately 75% of meals at this time. Labs and medications reviewed. Complains that he does not like food, but he will eat.  No nutrition interventions warranted at this time. If nutrition issues arise, please consult RD.   Satira Anis. Carolee Channell, MS, RD LDN Inpatient Clinical Dietitian Pager [phone]

## 2016-05-18 NOTE — H&P (Signed)
[place] [place] NAME: Edgar Padilla    MR#:  [id number]  DATE OF BIRTH:  02-07-1950  DATE OF ADMISSION:  05/18/2016  PRIMARY CARE PHYSICIAN: System, Provider Not In   REQUESTING/REFERRING PHYSICIAN: Dr. Dahlia Client  CHIEF COMPLAINT:   Chief Complaint  Patient presents with  . Loss of Consciousness    HISTORY OF PRESENT ILLNESS:  Edgar Padilla  is a 66 y.o. male with a known history of Pulmonary hypertension, chronic systolic CHF with ejection fraction 25-30%, hypertension, diabetes, chronic respiratory failure on 4 L oxygen presents to the emergency room after he had 2 syncopal episodes. Patient went to the restroom too wide. 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 It hurt him fall. Try to get him up but patient passed out again. He had sweating and nausea along with this episode. Lasted 3-5 minutes. He has been feeling lightheaded and dizzy. His torsemide dose was recently increased for congestive heart failure along with adding metolazone. Recently had similar symptoms one month back and had extensive workup with his cardiologist including carotid Dopplers and echocardiogram.  Orthostatics checked earlier showed systolic blood pressure dropped 35 points on standing from laying down.  PAST MEDICAL HISTORY:   Past Medical History:  Diagnosis Date  . Arthritis   . CHF (congestive heart failure) ([place])   . Coronary artery disease   . Diabetes mellitus without complication ([place])   . Hypertension   . Myocardial infarction ([place])   . Pulmonary fibrosis ([place])   . Pulmonary nodule   . Renal disorder   . Stroke [facility])    TIA    PAST SURGICAL HISTORY:   Past Surgical History:  Procedure Laterality Date  . BACK SURGERY    . CARDIAC SURGERY    . CAROTID STENT Left 11/12/2013  . CORONARY ARTERY BYPASS GRAFT    . EP IMPLANTABLE DEVICE    . heart     Catherization  . SHOULDER ARTHROSCOPY       SOCIAL HISTORY:   Social History  Substance Use Topics  . Smoking status: Never Smoker  . Smokeless tobacco: Never Used  . Alcohol use No    FAMILY HISTORY:   Family History  Problem Relation Age of Onset  . Heart failure Father   . Heart attack Father   . Rheum arthritis Mother     DRUG ALLERGIES:   Allergies  Allergen Reactions  . Entresto [Sacubitril-Valsartan] Other (See Comments)    Hypotension...  . Ticagrelor Shortness Of Breath    SOB  . Lisinopril     Other reaction(s): Other (See Comments) cough  . Restoril [Temazepam] Other (See Comments)    Altered mental status.   . Statins Other (See Comments)    Myalgias, hypotension    REVIEW OF SYSTEMS:   Review of Systems  Constitutional: Positive for malaise/fatigue. Negative for chills and fever.  HENT: Negative for sore throat.   Eyes: Negative for blurred vision, double vision and pain.  Respiratory: Negative for cough, hemoptysis, shortness of breath and wheezing.   Cardiovascular: Negative for chest pain, palpitations, orthopnea and leg swelling.  Gastrointestinal: Positive for nausea. Negative for abdominal pain, constipation, diarrhea, heartburn and vomiting.  Genitourinary: Negative for dysuria and hematuria.  Musculoskeletal: Positive for falls. Negative for back pain and joint pain.  Skin: Negative for rash.  Neurological: Positive for dizziness and weakness. Negative for sensory change, speech change, focal weakness and headaches.  Endo/Heme/Allergies: Does not bruise/bleed easily.  Psychiatric/Behavioral: Negative for depression. The patient is not nervous/anxious.     MEDICATIONS AT HOME:   Prior to Admission medications   Medication Sig Start Date End Date Taking? Authorizing Provider  acetaminophen (TYLENOL) 500 MG tablet Take 1,500 mg by mouth at bedtime.   Yes [provider]  aspirin EC 81 MG tablet Take 81 mg by mouth every morning.    Yes [provider]   carvedilol (COREG) 12.5 MG tablet Take 12.5 mg by mouth 2 (two) times daily with a meal.    Yes [provider]  cyclobenzaprine (FLEXERIL) 10 MG tablet Take 10 mg by mouth every 12 (twelve) hours as needed for muscle spasms. 05/01/16  Yes [provider]  docusate sodium (COLACE) 100 MG capsule Take 100 mg by mouth daily as needed for mild constipation.   Yes [provider]  escitalopram (LEXAPRO) 20 MG tablet Take 20 mg by mouth every morning.    Yes [provider]  finasteride (PROSCAR) 5 MG tablet Take 5 mg by mouth daily.   Yes [provider]  gabapentin (NEURONTIN) 300 MG capsule Take 300 mg by mouth 3 (three) times daily.    Yes [provider]  insulin lispro (HUMALOG) 100 UNIT/ML injection Inject 1-160 Units into the skin 3 (three) times daily before meals. *Sliding scale and carb count*   Yes [provider]  insulin NPH Human (HUMULIN N,NOVOLIN N) 100 UNIT/ML injection Inject 0.2 mLs (20 Units total) into the skin 2 (two) times daily. *Patient uses Humulin N* Patient taking differently: Inject 24 Units into the skin 2 (two) times daily. *Patient uses Humulin N* 10/11/15  Yes Theodoro Grist, MD  losartan (COZAAR) 25 MG tablet Take 25 mg by mouth daily. 05/07/16  Yes [provider]  metolazone (ZAROXOLYN) 2.5 MG tablet Take 2.5 mg by mouth every Wednesday.   Yes [provider]  nitroGLYCERIN (NITROSTAT) 0.4 MG SL tablet Place 0.4 mg under the tongue every 5 (five) minutes as needed for chest pain.   Yes [provider]  omeprazole (PRILOSEC) 40 MG capsule Take 40 mg by mouth every morning.    Yes [provider]  oxyCODONE-acetaminophen (PERCOCET/ROXICET) 5-325 MG tablet Take 1 tablet by mouth See admin instructions. Take 1 tablet by mouth every 6 to 8 hours as needed for pain. 09/18/15  Yes [provider]  tamsulosin (FLOMAX) 0.4 MG CAPS capsule Take 0.4 mg by mouth every morning.    Yes [provider]  torsemide (DEMADEX) 20 MG tablet Take 40-80 mg by mouth 2 (two) times daily. Take 80 mg by mouth in the morning and take 40 mg by mouth at night. 04/29/16  Yes [provider]  bumetanide (BUMEX) 2 MG tablet Take 2 tablets (4 mg total) by mouth daily. Patient not taking: Reported on 05/18/2016 10/11/15   Theodoro Grist, MD  clopidogrel (PLAVIX) 75 MG tablet Take 75 mg by mouth daily.    [provider]  predniSONE (STERAPRED UNI-PAK 21 TAB) 10 MG (21) TBPK tablet Take 1 tablet (10 mg total) by mouth daily. Please take 6 pills in the morning on the day one, then taper by 1 pill daily until finished, thank you Patient not taking: Reported on 04/03/2016 10/11/15   Theodoro Grist, MD     VITAL SIGNS:  Blood pressure 122/65, pulse 76, temperature 97.4 F (36.3 C), temperature source Oral, resp. rate 18, height 5\' 10"  (1.778 m), weight 100 kg (  220 lb 8 oz), SpO2 100 %.  PHYSICAL EXAMINATION:  Physical Exam  GENERAL:  66 y.o.-year-old patient lying in the bed with no acute distress.  EYES: Pupils equal, round, reactive to light and accommodation. No scleral icterus. Extraocular muscles intact.  HEENT: Head atraumatic, normocephalic. Oropharynx and nasopharynx clear. No oropharyngeal erythema, moist oral mucosa  NECK:  Supple, no jugular venous distention. No thyroid enlargement, no tenderness.  LUNGS: Normal breath sounds bilaterally, no wheezing, rales, rhonchi. No use of accessory muscles of respiration.  CARDIOVASCULAR: S1, S2 normal. No murmurs, rubs, or gallops.  ABDOMEN: Soft, nontender, nondistended. Bowel sounds present. No organomegaly or mass.  EXTREMITIES: No pedal edema, cyanosis, or clubbing. + 2 pedal & radial pulses b/l.   NEUROLOGIC: Cranial nerves II through XII are intact. No focal Motor or sensory deficits appreciated b/l PSYCHIATRIC: The patient is alert and oriented x 3. Good affect.  SKIN: No obvious rash, lesion, or ulcer.    LABORATORY PANEL:   CBC  Recent Labs Lab 05/18/16 0345  WBC 10.7*  HGB 11.1*  HCT 32.4*  PLT 263   ------------------------------------------------------------------------------------------------------------------  Chemistries   Recent Labs Lab 05/18/16 0345  NA 137  K 3.4*  CL 94*  CO2 31  GLUCOSE 155*  BUN 75*  CREATININE 2.83*  CALCIUM 8.3*   ------------------------------------------------------------------------------------------------------------------  Cardiac Enzymes  Recent Labs Lab 05/18/16 0646  TROPONINI <0.03   ------------------------------------------------------------------------------------------------------------------  RADIOLOGY:  Ct Head Wo Contrast  Result Date: 05/18/2016 CLINICAL DATA:  [place] twice tonight, syncope. History of hypertension, stroke. EXAM: CT HEAD WITHOUT CONTRAST CT CERVICAL SPINE WITHOUT CONTRAST TECHNIQUE: Multidetector CT imaging of the head and cervical spine was performed following the standard protocol without intravenous contrast. Multiplanar CT image reconstructions of the cervical spine were also generated. COMPARISON:  CT HEAD Jun 06, 2015 and CT cervical spine January 31, 2015 FINDINGS: CT HEAD FINDINGS BRAIN: No intraparenchymal hemorrhage, mass effect nor midline shift. The ventricles and sulci are normal for age. Patchy supratentorial white matter hypodensities less than expected for patient's age, though non-specific are most compatible with chronic small vessel ischemic disease. No acute large vascular territory infarcts. No abnormal extra-axial fluid collections. Basal cisterns are patent. VASCULAR: Mild to moderate calcific atherosclerosis of the carotid siphons. SKULL: No skull fracture. No significant scalp soft tissue swelling. SINUSES/ORBITS: Trace paranasal sinus mucosal thickening. Mastoid air cells are well aerated.The included ocular globes and orbital contents are non-suspicious. LEFT foot bilateral ocular  lens implants. OTHER: None. CT CERVICAL SPINE FINDINGS ALIGNMENT: Broad reversed lordosis. Vertebral bodies in alignment. SKULL BASE AND VERTEBRAE: Cervical vertebral bodies and posterior elements are intact. C5-6 auto interbody arthrodesis. Severe C6-7 disc height loss, endplate sclerosis and marginal spurring unchanged from prior CT. Severe upper cervical facet arthropathy. C1-2 articulation maintained with mild arthropathy. No destructive bony lesions. SOFT TISSUES AND SPINAL CANAL: Nonacute. Bilateral carotid artery stents. DISC LEVELS: No significant osseous canal stenosis. Severe RIGHT C3-4 neural foraminal narrowing. UPPER CHEST: Bronchial wall thickening and patchy airspace opacities RIGHT lung apex . Patulous esophagus. OTHER: None. IMPRESSION: CT HEAD: No acute intracranial process; negative CT HEAD for age. CT CERVICAL SPINE: No acute fracture or malalignment. Stable examination including C5-6 arthrodesis and C6-7 adjacent segment disease. RIGHT apical bronchial wall thickening and patchy airspace opacities, recommend chest radiograph. Electronically Signed   By: Elon Alas M.D.   On: 05/18/2016 04:26   Ct Cervical Spine Wo Contrast  Result Date: 05/18/2016 CLINICAL DATA:  [place] twice tonight, syncope. History of hypertension, stroke.  EXAM: CT HEAD WITHOUT CONTRAST CT CERVICAL SPINE WITHOUT CONTRAST TECHNIQUE: Multidetector CT imaging of the head and cervical spine was performed following the standard protocol without intravenous contrast. Multiplanar CT image reconstructions of the cervical spine were also generated. COMPARISON:  CT HEAD Jun 06, 2015 and CT cervical spine January 31, 2015 FINDINGS: CT HEAD FINDINGS BRAIN: No intraparenchymal hemorrhage, mass effect nor midline shift. The ventricles and sulci are normal for age. Patchy supratentorial white matter hypodensities less than expected for patient's age, though non-specific are most compatible with chronic small vessel ischemic disease.  No acute large vascular territory infarcts. No abnormal extra-axial fluid collections. Basal cisterns are patent. VASCULAR: Mild to moderate calcific atherosclerosis of the carotid siphons. SKULL: No skull fracture. No significant scalp soft tissue swelling. SINUSES/ORBITS: Trace paranasal sinus mucosal thickening. Mastoid air cells are well aerated.The included ocular globes and orbital contents are non-suspicious. LEFT foot bilateral ocular lens implants. OTHER: None. CT CERVICAL SPINE FINDINGS ALIGNMENT: Broad reversed lordosis. Vertebral bodies in alignment. SKULL BASE AND VERTEBRAE: Cervical vertebral bodies and posterior elements are intact. C5-6 auto interbody arthrodesis. Severe C6-7 disc height loss, endplate sclerosis and marginal spurring unchanged from prior CT. Severe upper cervical facet arthropathy. C1-2 articulation maintained with mild arthropathy. No destructive bony lesions. SOFT TISSUES AND SPINAL CANAL: Nonacute. Bilateral carotid artery stents. DISC LEVELS: No significant osseous canal stenosis. Severe RIGHT C3-4 neural foraminal narrowing. UPPER CHEST: Bronchial wall thickening and patchy airspace opacities RIGHT lung apex . Patulous esophagus. OTHER: None. IMPRESSION: CT HEAD: No acute intracranial process; negative CT HEAD for age. CT CERVICAL SPINE: No acute fracture or malalignment. Stable examination including C5-6 arthrodesis and C6-7 adjacent segment disease. RIGHT apical bronchial wall thickening and patchy airspace opacities, recommend chest radiograph. Electronically Signed   By: Elon Alas M.D.   On: 05/18/2016 04:26     IMPRESSION AND PLAN:   * Orthostatic syncope likely from Over diuresis Start fluids. Check orthostatics later in the evening. Fall precautions. Continue telemetry monitoring. He recently had echocardiogram and carotid Dopplers this cardiologist as outpatient. Will not repeat the studies.  * Hypertension. Continue home medications.  * CKD stage  III is stable. Creatinine 2.3. We'll repeat labs in the morning after IV fluids.  * Diabetes mellitus. Continue home dose of insulin. Sliding scale insulin.  * Chronic respiratory failure on 3-4 L oxygen. Stable.  * DVT prophylaxis with heparin  All the records are reviewed and case discussed with ED provider. Management plans discussed with the patient, family and they are in agreement.  CODE STATUS: DNR  TOTAL TIME TAKING CARE OF THIS PATIENT: 40 minutes.   Hillary Bow R M.D on 05/18/2016 at 12:19 PM  Between 7am to 6pm - Pager - [phone]  After 6pm go to [email or web address] - password [email or web address] [place] [facility]  Office  [phone]  CC: Primary care physician; System, Provider Not In  Note: This dictation was prepared with Dragon dictation along with smaller phrase technology. Any transcriptional errors that result from this process are unintentional.

## 2016-05-18 NOTE — ED Notes (Signed)
In and out cath performed by [facility] NT, witnessed by [facility] RN

## 2016-05-18 NOTE — Progress Notes (Signed)
Chaplain visited the patient while rounding at unit. Patient told chaplain that he has been having some health challenges, but his faith has never been shaken. Patient told chaplain that he is more than ready to go to heaven. Patient said that with a simile on his face. Patient has two daughter and grand children who mean the world to him. Chaplain prayed with patient as a conclusion of the visit.   05/18/16 1400  Clinical Encounter Type  Visited With Patient  Visit Type Initial  Spiritual Encounters  Spiritual Needs Prayer

## 2016-05-18 NOTE — ED Triage Notes (Signed)
Patient had unwitnessed syncopal episode today. Pt c/o nausea and right knee pain. Patient found on the bathroom floor by wife. Pt does not remember fall.

## 2016-05-18 NOTE — ED Notes (Signed)
Wife took pt's meds and clothes home

## 2016-05-18 NOTE — ED Provider Notes (Signed)
[facility] Emergency Department Provider Note   ____________________________________________   First MD Initiated Contact with Patient 05/18/16 0330     (approximate)  I have reviewed the triage vital signs and the nursing notes.   HISTORY  Chief Complaint Loss of Consciousness    HPI Edgar Padilla is a 66 y.o. male who comes into the hospital today with a syncopal episode. The patient states that his wife told him he passed out. He woke up on the bathroom floor. He had gotten up to use the restroom. He was urinating he thinks but does not remember what happened. He states that when he woke up his clothes were wet and he was nauseous. As he was walking across the living room he reports that he dry heaved a few times. He told his wife that he thought he needed help. Earlier in the day the patient reports that he was doing great. He denies any chest pain but again he still has some nausea. He hasn't mild headache but he states that he hit his head hard. The patient wears 4 L of O2 by nasal cannula daily. The patient's wife reports that he fell twice. She heard the noise and when she went saw him again he stated he felt nauseous and then he fell again. The patient does have a pacemaker/defibrillator but does not report his defibrillator going off.The patient did have some right knee pain and is currently complaining of back pain. He rates his pain a 1 out of 10 in intensity.   Past Medical History:  Diagnosis Date  . Arthritis   . CHF (congestive heart failure) ([place])   . Coronary artery disease   . Diabetes mellitus without complication ([place])   . Hypertension   . Myocardial infarction ([place])   . Pulmonary fibrosis ([place])   . Pulmonary nodule   . Renal disorder   . Stroke [facility])    TIA    Patient Active Problem List   Diagnosis Date Noted  . Near syncope 10/11/2015  . Acute on chronic renal failure ([place]) 10/11/2015  . Dehydration 10/11/2015  . Hypotension  10/11/2015  . Seizure disorder ([place]) 10/11/2015  . Right knee pain 10/11/2015  . Chronic systolic CHF (congestive heart failure) ([place]) 10/11/2015  . Chronic respiratory failure with hypoxia ([place]) 10/11/2015  . Gout attack 10/11/2015  . Renal failure (ARF), acute on chronic (HCC) 10/09/2015  . Carotid stenosis   . HLD (hyperlipidemia)   . Bladder outflow obstruction 11/22/2013  . Chronic kidney disease 11/22/2013  . Diabetic neuropathy ([place]) 11/22/2013  . Diabetic retinitis ([place]) 11/22/2013  . Gout 11/22/2013  . TIA (transient ischemic attack) 11/22/2013  . CVA (cerebral vascular accident) ([place]) 11/22/2013  . Essential (primary) hypertension 11/20/2013  . Sick sinus syndrome ([place]) 11/02/2013  . Biventricular automatic implantable cardioverter defibrillator in situ 10/11/2013  . Obstructive apnea 08/18/2013  . CCF (congestive cardiac failure) ([place]) 07/24/2013  . Lung mass 05/03/2013  . Body aches 04/30/2013  . Benign prostatic hyperplasia with urinary obstruction 04/30/2013  . Arteriosclerosis of coronary artery 04/30/2013  . Failure respiratory ([place]) 03/22/2013  . Cardiomyopathy, ischemic 03/14/2013  . History of myocardial infarct at age less than 52 years 01/20/2013  . Diabetes ([place]) 07/22/2012  . H/O coronary artery bypass surgery 07/22/2012  . 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 CA of skin 11/26/2011    Past Surgical History:  Procedure Laterality Date  . BACK SURGERY    .  CARDIAC SURGERY    . CAROTID STENT Left 11/12/2013  . CORONARY ARTERY BYPASS GRAFT    . EP IMPLANTABLE DEVICE    . heart     Catherization  . SHOULDER ARTHROSCOPY      Prior to Admission medications   Medication Sig Start Date End Date Taking? Authorizing Provider  acetaminophen (TYLENOL) 500 MG tablet Take 1,500 mg by mouth at bedtime.    [provider]  aspirin EC 81 MG tablet Take 81 mg by mouth every morning.     [provider]  bumetanide (BUMEX) 2 MG tablet Take 2 tablets (4 mg total) by mouth daily. 10/11/15   Theodoro Grist, MD  carvedilol (COREG) 6.25 MG tablet Take 6.25 mg by mouth 2 (two) times daily with a meal.    [provider]  clopidogrel (PLAVIX) 75 MG tablet Take 75 mg by mouth daily.    [provider]  docusate sodium (COLACE) 100 MG capsule Take 100 mg by mouth daily as needed for mild constipation.    [provider]  escitalopram (LEXAPRO) 20 MG tablet Take 20 mg by mouth every morning.     [provider]  finasteride (PROSCAR) 5 MG tablet Take 5 mg by mouth daily.    [provider]  gabapentin (NEURONTIN) 300 MG capsule Take 300 mg by mouth 2 (two) times daily.    [provider]  insulin lispro (HUMALOG) 100 UNIT/ML injection Inject 1-160 Units into the skin 3 (three) times daily before meals. *Sliding scale and carb count*    [provider]  insulin NPH Human (HUMULIN N,NOVOLIN N) 100 UNIT/ML injection Inject 0.2 mLs (20 Units total) into the skin 2 (two) times daily. *Patient uses Humulin N* 10/11/15   Theodoro Grist, MD  losartan (COZAAR) 50 MG tablet Take 50 mg by mouth daily.    [provider]  nitroGLYCERIN (NITROSTAT) 0.4 MG SL tablet Place 0.4 mg under the tongue every 5 (five) minutes as needed for chest pain.    [provider]  omeprazole (PRILOSEC) 40 MG capsule Take 40 mg by mouth every morning.     [provider]  oxyCODONE-acetaminophen (PERCOCET/ROXICET) 5-325 MG tablet Take 1 tablet by mouth See admin instructions. Take 1 tablet by mouth every 6 to 8 hours as needed for pain. 09/18/15   [provider]  phenytoin (DILANTIN) 100 MG ER capsule Take 200-300 mg by mouth See admin instructions. Take 2 capsules (200 mg) by mouth in the morning and Take 3 capsules (300 mg) by mouth every night at bedtime.    [provider]  predniSONE (STERAPRED UNI-PAK 21 TAB) 10 MG  (21) TBPK tablet Take 1 tablet (10 mg total) by mouth daily. Please take 6 pills in the morning on the day one, then taper by 1 pill daily until finished, thank you Patient not taking: Reported on 04/03/2016 10/11/15   Theodoro Grist, MD  spironolactone (ALDACTONE) 25 MG tablet Take 25 mg by mouth every morning.     [provider]  tamsulosin (FLOMAX) 0.4 MG CAPS capsule Take 0.4 mg by mouth every morning.    [provider]    Allergies Ticagrelor; Lisinopril; Restoril [temazepam]; and Statins  Family History  Problem Relation Age of Onset  . Heart failure Father   . Heart attack Father   . Rheum arthritis Mother     Social History Social History  Substance Use Topics  . Smoking status: Never Smoker  . Smokeless  tobacco: Never Used  . Alcohol use No    Review of Systems  Constitutional: Sweats Eyes: No visual changes. ENT: No sore throat. Cardiovascular: Denies chest pain. Respiratory: Denies shortness of breath. Gastrointestinal: Nausea with No abdominal pain, no vomiting.  No diarrhea.  No constipation. Genitourinary: Negative for dysuria. Musculoskeletal: Negative for back pain. Skin: Negative for rash. Neurological: Syncope   ____________________________________________   PHYSICAL EXAM:  VITAL SIGNS: ED Triage Vitals  Enc Vitals Group     BP 05/18/16 0333 126/71     Pulse Rate 05/18/16 0333 75     Resp 05/18/16 0333 14     Temp 05/18/16 0337 97.9 F (36.6 C)     Temp Source 05/18/16 0337 Oral     SpO2 05/18/16 0333 97 %     Weight 05/18/16 0338 222 lb (100.7 kg)     Height 05/18/16 0338 5\' 10"  (1.778 m)     Head Circumference --      Peak Flow --      Pain Score 05/18/16 0338 1     Pain Loc --      Pain Edu? --      Excl. in [place]? --     Constitutional: Alert and oriented. Well appearing and in Mild distress. Eyes: Conjunctivae are normal. PERRL. EOMI. Head: Atraumatic. Nose: No congestion/rhinnorhea. Mouth/Throat: Mucous  membranes are moist.  Oropharynx non-erythematous. Neck: No cervical spine tenderness to palpation Cardiovascular: Normal rate, regular rhythm. Grossly normal heart sounds.  Good peripheral circulation. Respiratory: Normal respiratory effort.  No retractions. Lungs CTAB. Gastrointestinal: Soft and nontender. No distention. Positive bowel sounds Musculoskeletal: No lower extremity tenderness nor edema.   Neurologic:  Normal speech and language.  Skin:  Skin is warm, dry and intact. Marland Kitchen Psychiatric: Mood and affect are normal.   ____________________________________________   LABS (all labs ordered are listed, but only abnormal results are displayed)  Labs Reviewed  CBC - Abnormal; Notable for the following:       Result Value   WBC 10.7 (*)    RBC 3.60 (*)    Hemoglobin 11.1 (*)    HCT 32.4 (*)    All other components within normal limits  BASIC METABOLIC PANEL - Abnormal; Notable for the following:    Potassium 3.4 (*)    Chloride 94 (*)    Glucose, Bld 155 (*)    BUN 75 (*)    Creatinine, Ser 2.83 (*)    Calcium 8.3 (*)    GFR calc non Af Amer 22 (*)    GFR calc Af Amer 25 (*)    All other components within normal limits  URINALYSIS, COMPLETE (UACMP) WITH MICROSCOPIC - Abnormal; Notable for the following:    Color, Urine STRAW (*)    APPearance CLEAR (*)    Glucose, UA 50 (*)    Squamous Epithelial / LPF 0-5 (*)    All other components within normal limits  TROPONIN I  TROPONIN I   ____________________________________________  EKG  ED ECG REPORT I, Loney Hering, the attending physician, personally viewed and interpreted this ECG.   Date: 05/18/2016  EKG Time: 334  Rate: 75  Rhythm: AV dual paced rhythm  Axis: right axis deviation  Intervals:none  ST&T Change: none  ____________________________________________  RADIOLOGY  CT head and cervical spine ____________________________________________   PROCEDURES  Procedure(s) performed:  None  Procedures  Critical Care performed: No  ____________________________________________   INITIAL IMPRESSION / ASSESSMENT AND PLAN / ED COURSE  Pertinent  labs & imaging results that were available during my care of the patient were reviewed by me and considered in my medical decision making (see chart for details).  This is a 66 year old male who syncopized at home. He was sweaty and nauseous. We will check a CT scan of his head and cervical spine as well as some blood work.  Clinical Course as of May 18 716  Mon May 18, 2016  0445 CT HEAD: No acute intracranial process; negative CT HEAD for age.  CT CERVICAL SPINE: No acute fracture or malalignment.  Stable examination including C5-6 arthrodesis and C6-7 adjacent segment disease.  RIGHT apical bronchial wall thickening and patchy airspace opacities, recommend chest radiograph.   CT Head Wo Contrast [AW]    Clinical Course User Index [AW] Loney Hering, MD   I did go to reassess the patient. He is laying on the bed and states these tired but he is very pale and seems to be grimacing in discomfort. Given the patient's significant medical history I will admit the patient to the hospitalist service for further evaluation. We will check orthostatics and admit the patient.   ____________________________________________   FINAL CLINICAL IMPRESSION(S) / ED DIAGNOSES  Final diagnoses:  Syncope, unspecified syncope type  Diaphoresis  Dizziness      NEW MEDICATIONS STARTED DURING THIS VISIT:  New Prescriptions   No medications on file     Note:  This document was prepared using Dragon voice recognition software and may include unintentional dictation errors.    Loney Hering, MD 05/18/16 [phone]

## 2016-05-19 DIAGNOSIS — R55 Syncope and collapse: Secondary | ICD-10-CM | POA: Diagnosis not present

## 2016-05-19 LAB — BASIC METABOLIC PANEL
ANION GAP: 10 (ref 5–15)
BUN: 73 mg/dL — ABNORMAL HIGH (ref 6–20)
CHLORIDE: 99 mmol/L — AB (ref 101–111)
CO2: 29 mmol/L (ref 22–32)
CREATININE: 2.37 mg/dL — AB (ref 0.61–1.24)
Calcium: 7.8 mg/dL — ABNORMAL LOW (ref 8.9–10.3)
GFR calc non Af Amer: 27 mL/min — ABNORMAL LOW (ref >60)
GFR, EST AFRICAN AMERICAN: 31 mL/min — AB (ref >60)
Glucose, Bld: 243 mg/dL — ABNORMAL HIGH (ref 65–99)
POTASSIUM: 4.1 mmol/L (ref 3.5–5.1)
SODIUM: 138 mmol/L (ref 135–145)

## 2016-05-19 LAB — HEMOGLOBIN A1C
HEMOGLOBIN A1C: 7 % — AB (ref 4.8–5.6)
MEAN PLASMA GLUCOSE: 154 mg/dL

## 2016-05-19 LAB — GLUCOSE, CAPILLARY: GLUCOSE-CAPILLARY: 215 mg/dL — AB (ref 65–99)

## 2016-05-19 MED ORDER — TORSEMIDE 20 MG PO TABS
80.0000 mg | ORAL_TABLET | Freq: Once | ORAL | Status: AC
  Administered 2016-05-19: 80 mg via ORAL
  Filled 2016-05-19: qty 4

## 2016-05-19 NOTE — Progress Notes (Signed)
IV and tele removed from patient. Discharge instructions given to patient. Patient verbalized understanding. No distress at this time. Oxygen attached to patient and wife to transport patient home.

## 2016-05-19 NOTE — Discharge Instructions (Signed)
You have to follow up with your cardiologist and Neurologist at [facility] in 1-2 weeks

## 2016-05-19 NOTE — Care Management Obs Status (Signed)
[facility] NOTIFICATION   Patient Details  Name: Adrian Dinovo MRN: [id number] Date of Birth: 02-20-1950   Medicare Observation Status Notification Given:  Yes    Katrina Stack, RN 05/19/2016, 8:08 AM

## 2016-05-23 NOTE — Discharge Summary (Signed)
[facility] [place] NAME: Edgar Padilla    MR#:  [id number]  DATE OF BIRTH:  [birth year]  DATE OF ADMISSION:  05/18/2016 ADMITTING PHYSICIAN: Hillary Bow, MD  DATE OF DISCHARGE: 05/19/2016 11:14 AM  PRIMARY CARE PHYSICIAN: System, Provider Not In   ADMISSION DIAGNOSIS:  Diaphoresis [R61] Dizziness [R42] Syncope, unspecified syncope type [R55]  DISCHARGE DIAGNOSIS:  Active Problems:   Syncope   SECONDARY DIAGNOSIS:   Past Medical History:  Diagnosis Date  . Arthritis   . CHF (congestive heart failure) ([place])   . Coronary artery disease   . Diabetes mellitus without complication ([place])   . Hypertension   . Myocardial infarction ([place])   . Pulmonary fibrosis ([place])   . Pulmonary nodule   . Renal disorder   . Stroke [facility])    TIA     ADMITTING HISTORY  HISTORY OF PRESENT ILLNESS:  Edgar Padilla  is a 66 y.o. male with a known history of Pulmonary hypertension, chronic systolic CHF with ejection fraction 25-30%, hypertension, diabetes, chronic respiratory failure on 4 L oxygen presents to the emergency room after he had 2 syncopal episodes. Patient went to the restroom too wide. 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 It hurt him fall. Try to get him up but patient passed out again. He had sweating and nausea along with this episode. Lasted 3-5 minutes. He has been feeling lightheaded and dizzy. His torsemide dose was recently increased for congestive heart failure along with adding metolazone. Recently had similar symptoms one month back and had extensive workup with his cardiologist including carotid Dopplers and echocardiogram.  Orthostatics checked earlier showed systolic blood pressure dropped 35 points on standing from laying down.   HOSPITAL COURSE:   * Orthostatic syncope likely from Over diuresis Improved with holding diuretics and starting on IV fluids. He recently had  echocardiogram and carotid Dopplers With his cardiologist as outpatient. Did not repeat the studies. Telemetry monitoring showed no arrhythmias Still is orthostatic at discharge but this is chronic. Counseled regarding precautions from laying to sitting and standing.  * Hypertension. Continue home medications.  * AKI over CKD stage III POA Improved on holding diuretics and putting on IV fluids.  * Chronic systolic CHF. Resumed diuretics at discharge.  * Diabetes mellitus. Continue home dose of insulin. Sliding scale insulin.  * Chronic respiratory failure on 3-4 L oxygen. Stable.  Stable for discharge home to follow-up with his cardiologist and neurologist for ongoing evaluation of syncope at [facility].  CONSULTS OBTAINED:    DRUG ALLERGIES:   Allergies  Allergen Reactions  . Entresto [Sacubitril-Valsartan] Other (See Comments)    Hypotension...  . Ticagrelor Shortness Of Breath    SOB  . Lisinopril     Other reaction(s): Other (See Comments) cough  . Restoril [Temazepam] Other (See Comments)    Altered mental status.   . Statins Other (See Comments)    Myalgias, hypotension    DISCHARGE MEDICATIONS:   Discharge Medication List as of 05/19/2016 10:16 AM    CONTINUE these medications which have NOT CHANGED   Details  acetaminophen (TYLENOL) 500 MG tablet Take 1,500 mg by mouth at bedtime., Historical Med    aspirin EC 81 MG tablet Take 81 mg by mouth every morning. , Historical Med    carvedilol (COREG) 12.5 MG tablet Take 12.5 mg by mouth 2 (two) times daily with a meal. , Historical Med  cyclobenzaprine (FLEXERIL) 10 MG tablet Take 10 mg by mouth every 12 (twelve) hours as needed for muscle spasms., Starting Fri 05/01/2016, Historical Med    docusate sodium (COLACE) 100 MG capsule Take 100 mg by mouth daily as needed for mild constipation., Historical Med    escitalopram (LEXAPRO) 20 MG tablet Take 20 mg by mouth every morning. , Historical Med    finasteride  (PROSCAR) 5 MG tablet Take 5 mg by mouth daily., Historical Med    gabapentin (NEURONTIN) 300 MG capsule Take 300 mg by mouth 3 (three) times daily. , Historical Med    insulin lispro (HUMALOG) 100 UNIT/ML injection Inject 1-160 Units into the skin 3 (three) times daily before meals. *Sliding scale and carb count*, Historical Med    insulin NPH Human (HUMULIN N,NOVOLIN N) 100 UNIT/ML injection Inject 0.2 mLs (20 Units total) into the skin 2 (two) times daily. *Patient uses Humulin N*, Starting Fri 10/11/2015, Normal    losartan (COZAAR) 25 MG tablet Take 25 mg by mouth daily., Starting Thu 05/07/2016, Historical Med    metolazone (ZAROXOLYN) 2.5 MG tablet Take 2.5 mg by mouth every Wednesday., Historical Med    nitroGLYCERIN (NITROSTAT) 0.4 MG SL tablet Place 0.4 mg under the tongue every 5 (five) minutes as needed for chest pain., Until Discontinued, Historical Med    omeprazole (PRILOSEC) 40 MG capsule Take 40 mg by mouth every morning. , Historical Med    oxyCODONE-acetaminophen (PERCOCET/ROXICET) 5-325 MG tablet Take 1 tablet by mouth See admin instructions. Take 1 tablet by mouth every 6 to 8 hours as needed for pain., Starting Wed 09/18/2015, Historical Med    tamsulosin (FLOMAX) 0.4 MG CAPS capsule Take 0.4 mg by mouth every morning., Historical Med    torsemide (DEMADEX) 20 MG tablet Take 40-80 mg by mouth 2 (two) times daily. Take 80 mg by mouth in the morning and take 40 mg by mouth at night., Starting Wed 04/29/2016, Historical Med    clopidogrel (PLAVIX) 75 MG tablet Take 75 mg by mouth daily., Until Discontinued, Historical Med      STOP taking these medications     bumetanide (BUMEX) 2 MG tablet      predniSONE (STERAPRED UNI-PAK 21 TAB) 10 MG (21) TBPK tablet         Today   VITAL SIGNS:  Blood pressure (!) 144/59, pulse 76, temperature 98.1 F (36.7 C), temperature source Oral, resp. rate 16, height 5\' 10"  (1.778 m), weight 99.5 kg (219 lb 6.4 oz), SpO2 97 %.  I/O:   No intake or output data in the 24 hours ending 05/23/16 1226  PHYSICAL EXAMINATION:  Physical Exam  GENERAL:  66 y.o.-year-old patient lying in the bed with no acute distress.  LUNGS: Normal breath sounds bilaterally, no wheezing, rales,rhonchi or crepitation. No use of accessory muscles of respiration.  CARDIOVASCULAR: S1, S2 normal. No murmurs, rubs, or gallops.  ABDOMEN: Soft, non-tender, non-distended. Bowel sounds present. No organomegaly or mass.  NEUROLOGIC: Moves all 4 extremities. PSYCHIATRIC: The patient is alert and oriented x 3.  SKIN: No obvious rash, lesion, or ulcer.   DATA REVIEW:   CBC  Recent Labs Lab 05/18/16 0345  WBC 10.7*  HGB 11.1*  HCT 32.4*  PLT 263    Chemistries   Recent Labs Lab 05/18/16 0646 05/19/16 0442  NA  --  138  K  --  4.1  CL  --  99*  CO2  --  29  GLUCOSE  --  243*  BUN  --  73*  CREATININE  --  2.37*  CALCIUM  --  7.8*  MG 1.8  --     Cardiac Enzymes  Recent Labs Lab 05/18/16 1115  TROPONINI <0.03    Microbiology Results  No results found for this or any previous visit.  RADIOLOGY:  No results found.  Follow up with PCP in 1 week.  Management plans discussed with the patient, family and they are in agreement.  CODE STATUS:  Code Status History    Date Active Date Inactive Code Status Order ID Comments User Context   05/18/2016 10:50 AM 05/19/2016  2:19 PM DNR [id number]  Hillary Bow, MD Inpatient   05/18/2016  7:23 AM 05/18/2016 10:50 AM Full Code [id number]  Hillary Bow, MD ED   10/09/2015  6:02 PM 10/11/2015  7:23 PM DNR [id number]  Demetrios Loll, MD Inpatient   11/22/2013  8:08 PM 11/24/2013 12:23 AM DNR [id number]  Frazier Richards, MD Inpatient   11/22/2013  3:41 PM 11/22/2013  8:08 PM Full Code [id number]  Frazier Richards, MD Inpatient    Questions for Most Recent Historical Code Status (Order [id number])    Question Answer Comment   In the event of cardiac or respiratory ARREST Do not call a "code blue"    In  the event of cardiac or respiratory ARREST Do not perform Intubation, CPR, defibrillation or ACLS    In the event of cardiac or respiratory ARREST Use medication by any route, position, wound care, and other measures to relive pain and suffering. May use oxygen, suction and manual treatment of airway obstruction as needed for comfort.         Advance Directive Documentation     Most Recent Value  Type of Advance Directive  Out of facility DNR (pink MOST or yellow form)  Pre-existing out of facility DNR order (yellow form or pink MOST form)  Physician notified to receive inpatient order  "MOST" Form in Place?  -      TOTAL TIME TAKING CARE OF THIS PATIENT ON DAY OF DISCHARGE: more than 30 minutes.   Hillary Bow R M.D on 05/23/2016 at 12:26 PM  Between 7am to 6pm - Pager - [phone]  After 6pm go to [email or web address] - password [email or web address] [place] [facility]  Office  [phone]  CC: Primary care physician; System, Provider Not In  Note: This dictation was prepared with Dragon dictation along with smaller phrase technology. Any transcriptional errors that result from this process are unintentional.

## 2016-09-16 ENCOUNTER — Emergency Department: Payer: Medicare Other

## 2016-09-16 ENCOUNTER — Observation Stay
Admission: EM | Admit: 2016-09-16 | Discharge: 2016-09-19 | Disposition: A | Discharge status: Other Acute Inpatient Hospital | Payer: Medicare Other | Attending: Specialist | Admitting: Specialist

## 2016-09-16 DIAGNOSIS — I5023 Acute on chronic systolic (congestive) heart failure: Secondary | ICD-10-CM | POA: Diagnosis not present

## 2016-09-16 DIAGNOSIS — N400 Enlarged prostate without lower urinary tract symptoms: Secondary | ICD-10-CM | POA: Diagnosis not present

## 2016-09-16 DIAGNOSIS — Z8673 Personal history of transient ischemic attack (TIA), and cerebral infarction without residual deficits: Secondary | ICD-10-CM | POA: Diagnosis not present

## 2016-09-16 DIAGNOSIS — Z7902 Long term (current) use of antithrombotics/antiplatelets: Secondary | ICD-10-CM | POA: Diagnosis not present

## 2016-09-16 DIAGNOSIS — Z79899 Other long term (current) drug therapy: Secondary | ICD-10-CM | POA: Insufficient documentation

## 2016-09-16 DIAGNOSIS — K219 Gastro-esophageal reflux disease without esophagitis: Secondary | ICD-10-CM | POA: Insufficient documentation

## 2016-09-16 DIAGNOSIS — F329 Major depressive disorder, single episode, unspecified: Secondary | ICD-10-CM | POA: Diagnosis not present

## 2016-09-16 DIAGNOSIS — E785 Hyperlipidemia, unspecified: Secondary | ICD-10-CM | POA: Diagnosis not present

## 2016-09-16 DIAGNOSIS — Z95810 Presence of automatic (implantable) cardiac defibrillator: Secondary | ICD-10-CM | POA: Insufficient documentation

## 2016-09-16 DIAGNOSIS — E1140 Type 2 diabetes mellitus with diabetic neuropathy, unspecified: Secondary | ICD-10-CM | POA: Insufficient documentation

## 2016-09-16 DIAGNOSIS — J8410 Pulmonary fibrosis, unspecified: Secondary | ICD-10-CM | POA: Insufficient documentation

## 2016-09-16 DIAGNOSIS — M1990 Unspecified osteoarthritis, unspecified site: Secondary | ICD-10-CM | POA: Insufficient documentation

## 2016-09-16 DIAGNOSIS — D649 Anemia, unspecified: Secondary | ICD-10-CM | POA: Insufficient documentation

## 2016-09-16 DIAGNOSIS — Z7982 Long term (current) use of aspirin: Secondary | ICD-10-CM | POA: Insufficient documentation

## 2016-09-16 DIAGNOSIS — Z951 Presence of aortocoronary bypass graft: Secondary | ICD-10-CM | POA: Insufficient documentation

## 2016-09-16 DIAGNOSIS — E860 Dehydration: Secondary | ICD-10-CM | POA: Diagnosis not present

## 2016-09-16 DIAGNOSIS — N183 Chronic kidney disease, stage 3 unspecified: Secondary | ICD-10-CM

## 2016-09-16 DIAGNOSIS — R55 Syncope and collapse: Secondary | ICD-10-CM

## 2016-09-16 DIAGNOSIS — N185 Chronic kidney disease, stage 5: Secondary | ICD-10-CM | POA: Insufficient documentation

## 2016-09-16 DIAGNOSIS — M542 Cervicalgia: Secondary | ICD-10-CM | POA: Diagnosis not present

## 2016-09-16 DIAGNOSIS — G40909 Epilepsy, unspecified, not intractable, without status epilepticus: Secondary | ICD-10-CM | POA: Diagnosis not present

## 2016-09-16 DIAGNOSIS — E1122 Type 2 diabetes mellitus with diabetic chronic kidney disease: Secondary | ICD-10-CM | POA: Insufficient documentation

## 2016-09-16 DIAGNOSIS — Z981 Arthrodesis status: Secondary | ICD-10-CM | POA: Insufficient documentation

## 2016-09-16 DIAGNOSIS — I272 Pulmonary hypertension, unspecified: Secondary | ICD-10-CM | POA: Diagnosis not present

## 2016-09-16 DIAGNOSIS — N179 Acute kidney failure, unspecified: Principal | ICD-10-CM | POA: Insufficient documentation

## 2016-09-16 DIAGNOSIS — I132 Hypertensive heart and chronic kidney disease with heart failure and with stage 5 chronic kidney disease, or end stage renal disease: Secondary | ICD-10-CM | POA: Diagnosis not present

## 2016-09-16 DIAGNOSIS — Z66 Do not resuscitate: Secondary | ICD-10-CM | POA: Insufficient documentation

## 2016-09-16 DIAGNOSIS — I2510 Atherosclerotic heart disease of native coronary artery without angina pectoris: Secondary | ICD-10-CM | POA: Insufficient documentation

## 2016-09-16 DIAGNOSIS — Z794 Long term (current) use of insulin: Secondary | ICD-10-CM | POA: Insufficient documentation

## 2016-09-16 DIAGNOSIS — I252 Old myocardial infarction: Secondary | ICD-10-CM | POA: Insufficient documentation

## 2016-09-16 LAB — CBC WITH DIFFERENTIAL/PLATELET
BASOS ABS: 0.1 10*3/uL (ref 0–0.1)
BASOS PCT: 0 %
EOS ABS: 0.2 10*3/uL (ref 0–0.7)
Eosinophils Relative: 2 %
HCT: 28 % — ABNORMAL LOW (ref 40.0–52.0)
HEMOGLOBIN: 9.5 g/dL — AB (ref 13.0–18.0)
LYMPHS PCT: 7 %
Lymphs Abs: 0.9 10*3/uL — ABNORMAL LOW (ref 1.0–3.6)
MCH: 31.1 pg (ref 26.0–34.0)
MCHC: 34 g/dL (ref 32.0–36.0)
MCV: 91.6 fL (ref 80.0–100.0)
MONO ABS: 1.1 10*3/uL — AB (ref 0.2–1.0)
Monocytes Relative: 8 %
NEUTROS ABS: 11 10*3/uL — AB (ref 1.4–6.5)
Neutrophils Relative %: 83 %
Platelets: 223 10*3/uL (ref 150–440)
RBC: 3.06 MIL/uL — ABNORMAL LOW (ref 4.40–5.90)
RDW: 16.3 % — ABNORMAL HIGH (ref 11.5–14.5)
WBC: 13.2 10*3/uL — AB (ref 3.8–10.6)

## 2016-09-16 LAB — COMPREHENSIVE METABOLIC PANEL
ALBUMIN: 4.1 g/dL (ref 3.5–5.0)
ALK PHOS: 72 U/L (ref 38–126)
ALT: 12 U/L — ABNORMAL LOW (ref 17–63)
ANION GAP: 16 — AB (ref 5–15)
AST: 17 U/L (ref 15–41)
BUN: 103 mg/dL — ABNORMAL HIGH (ref 6–20)
CALCIUM: 8.3 mg/dL — AB (ref 8.9–10.3)
CO2: 25 mmol/L (ref 22–32)
Chloride: 91 mmol/L — ABNORMAL LOW (ref 101–111)
Creatinine, Ser: 4.01 mg/dL — ABNORMAL HIGH (ref 0.61–1.24)
GFR calc Af Amer: 17 mL/min — ABNORMAL LOW (ref >60)
GFR calc non Af Amer: 14 mL/min — ABNORMAL LOW (ref >60)
GLUCOSE: 367 mg/dL — AB (ref 65–99)
Potassium: 4.4 mmol/L (ref 3.5–5.1)
SODIUM: 132 mmol/L — AB (ref 135–145)
Total Bilirubin: 0.7 mg/dL (ref 0.3–1.2)
Total Protein: 7.2 g/dL (ref 6.5–8.1)

## 2016-09-16 LAB — TROPONIN I: Troponin I: 0.03 ng/mL (ref <0.03)

## 2016-09-16 LAB — GLUCOSE, CAPILLARY
GLUCOSE-CAPILLARY: 415 mg/dL — AB (ref 65–99)
Glucose-Capillary: 326 mg/dL — ABNORMAL HIGH (ref 65–99)

## 2016-09-16 MED ORDER — GABAPENTIN 300 MG PO CAPS
300.0000 mg | ORAL_CAPSULE | Freq: Three times a day (TID) | ORAL | Status: DC
  Administered 2016-09-16 – 2016-09-19 (×8): 300 mg via ORAL
  Filled 2016-09-16 (×9): qty 1

## 2016-09-16 MED ORDER — ASPIRIN EC 81 MG PO TBEC
81.0000 mg | DELAYED_RELEASE_TABLET | ORAL | Status: DC
  Administered 2016-09-17 – 2016-09-19 (×3): 81 mg via ORAL
  Filled 2016-09-16 (×3): qty 1

## 2016-09-16 MED ORDER — NITROGLYCERIN 0.4 MG SL SUBL: 0.4000 mg | SUBLINGUAL_TABLET | SUBLINGUAL | Status: DC | PRN

## 2016-09-16 MED ORDER — INSULIN ASPART 100 UNIT/ML ~~LOC~~ SOLN
0.0000 [IU] | Freq: Three times a day (TID) | SUBCUTANEOUS | Status: DC
  Administered 2016-09-16: 9 [IU] via SUBCUTANEOUS
  Administered 2016-09-17: 5 [IU] via SUBCUTANEOUS
  Administered 2016-09-17 (×2): 3 [IU] via SUBCUTANEOUS
  Administered 2016-09-18 (×3): 2 [IU] via SUBCUTANEOUS
  Administered 2016-09-19: 1 [IU] via SUBCUTANEOUS
  Administered 2016-09-19: 2 [IU] via SUBCUTANEOUS
  Filled 2016-09-16 (×9): qty 1

## 2016-09-16 MED ORDER — HEPARIN SODIUM (PORCINE) 5000 UNIT/ML IJ SOLN
5000.0000 [IU] | Freq: Three times a day (TID) | INTRAMUSCULAR | Status: DC
  Administered 2016-09-17 – 2016-09-18 (×3): 5000 [IU] via SUBCUTANEOUS
  Filled 2016-09-16 (×5): qty 1

## 2016-09-16 MED ORDER — ONDANSETRON HCL 4 MG PO TABS
4.0000 mg | ORAL_TABLET | Freq: Four times a day (QID) | ORAL | Status: DC | PRN
  Administered 2016-09-18: 4 mg via ORAL
  Filled 2016-09-16: qty 1

## 2016-09-16 MED ORDER — INSULIN ASPART 100 UNIT/ML ~~LOC~~ SOLN
0.0000 [IU] | Freq: Every day | SUBCUTANEOUS | Status: DC
Start: 2016-09-16 — End: 2016-09-19
  Administered 2016-09-16: 4 [IU] via SUBCUTANEOUS
  Administered 2016-09-17: 3 [IU] via SUBCUTANEOUS
  Filled 2016-09-16 (×2): qty 1

## 2016-09-16 MED ORDER — CARVEDILOL 6.25 MG PO TABS
6.2500 mg | ORAL_TABLET | Freq: Two times a day (BID) | ORAL | Status: DC
  Administered 2016-09-16 – 2016-09-19 (×6): 6.25 mg via ORAL
  Filled 2016-09-16 (×6): qty 1

## 2016-09-16 MED ORDER — CYCLOBENZAPRINE HCL 10 MG PO TABS
10.0000 mg | ORAL_TABLET | Freq: Two times a day (BID) | ORAL | Status: DC | PRN
Start: 2016-09-16 — End: 2016-09-17
  Administered 2016-09-17: 10 mg via ORAL
  Filled 2016-09-16: qty 1

## 2016-09-16 MED ORDER — PANTOPRAZOLE SODIUM 40 MG PO TBEC
40.0000 mg | DELAYED_RELEASE_TABLET | Freq: Every day | ORAL | Status: DC
  Administered 2016-09-17 – 2016-09-19 (×3): 40 mg via ORAL
  Filled 2016-09-16 (×3): qty 1

## 2016-09-16 MED ORDER — OXYCODONE-ACETAMINOPHEN 7.5-325 MG PO TABS
1.0000 | ORAL_TABLET | Freq: Four times a day (QID) | ORAL | Status: DC | PRN
  Administered 2016-09-16 – 2016-09-19 (×7): 1 via ORAL
  Filled 2016-09-16 (×7): qty 1

## 2016-09-16 MED ORDER — INSULIN NPH (HUMAN) (ISOPHANE) 100 UNIT/ML ~~LOC~~ SUSP
26.0000 [IU] | Freq: Two times a day (BID) | SUBCUTANEOUS | Status: DC
  Filled 2016-09-16: qty 10

## 2016-09-16 MED ORDER — LOSARTAN POTASSIUM 25 MG PO TABS
25.0000 mg | ORAL_TABLET | Freq: Every day | ORAL | Status: DC
  Administered 2016-09-17 – 2016-09-19 (×3): 25 mg via ORAL
  Filled 2016-09-16 (×3): qty 1

## 2016-09-16 MED ORDER — FINASTERIDE 5 MG PO TABS
5.0000 mg | ORAL_TABLET | Freq: Every day | ORAL | Status: DC
  Administered 2016-09-17 – 2016-09-19 (×3): 5 mg via ORAL
  Filled 2016-09-16 (×3): qty 1

## 2016-09-16 MED ORDER — INSULIN DETEMIR 100 UNIT/ML ~~LOC~~ SOLN
26.0000 [IU] | Freq: Two times a day (BID) | SUBCUTANEOUS | Status: DC
  Administered 2016-09-16 – 2016-09-18 (×4): 26 [IU] via SUBCUTANEOUS
  Filled 2016-09-16 (×5): qty 0.26

## 2016-09-16 MED ORDER — CLOPIDOGREL BISULFATE 75 MG PO TABS
75.0000 mg | ORAL_TABLET | Freq: Every day | ORAL | Status: DC
  Administered 2016-09-17 – 2016-09-19 (×3): 75 mg via ORAL
  Filled 2016-09-16 (×4): qty 1

## 2016-09-16 MED ORDER — SODIUM CHLORIDE 0.9 % IV SOLN
1000.0000 mL | Freq: Once | INTRAVENOUS | Status: AC
  Administered 2016-09-16: 1000 mL via INTRAVENOUS

## 2016-09-16 MED ORDER — ESCITALOPRAM OXALATE 20 MG PO TABS
20.0000 mg | ORAL_TABLET | ORAL | Status: DC
  Administered 2016-09-17 – 2016-09-19 (×3): 20 mg via ORAL
  Filled 2016-09-16 (×4): qty 1

## 2016-09-16 MED ORDER — DOCUSATE SODIUM 100 MG PO CAPS: 100.0000 mg | ORAL_CAPSULE | Freq: Every day | ORAL | Status: DC | PRN

## 2016-09-16 MED ORDER — SODIUM CHLORIDE 0.9 % IV SOLN
INTRAVENOUS | Status: DC
  Administered 2016-09-16 – 2016-09-17 (×2): via INTRAVENOUS

## 2016-09-16 MED ORDER — SODIUM CHLORIDE 0.9 % IV SOLN: 1000.0000 mL | Freq: Once | INTRAVENOUS | Status: DC

## 2016-09-16 MED ORDER — ONDANSETRON HCL 4 MG/2ML IJ SOLN
4.0000 mg | Freq: Once | INTRAMUSCULAR | Status: AC
  Administered 2016-09-16: 4 mg via INTRAVENOUS
  Filled 2016-09-16: qty 2

## 2016-09-16 MED ORDER — ONDANSETRON HCL 4 MG/2ML IJ SOLN
4.0000 mg | Freq: Four times a day (QID) | INTRAMUSCULAR | Status: DC | PRN
  Administered 2016-09-17 – 2016-09-19 (×5): 4 mg via INTRAVENOUS
  Filled 2016-09-16 (×5): qty 2

## 2016-09-16 NOTE — ED Triage Notes (Signed)
Pt arrives from [facility] on stretcher after near syncopal episode. Pt was placed in chair and never passed out, just felt like it. Went to [facility] d/t chest pain and R shoulder pain. [facility] RN states pt fell last week. States R shoulder pain this morning. Pt has defibrillator in chest, states he is going to have open heart surgery soon. Pt is alert and oriented while awake. Pt will close eyes for a few seconds and won't talk. Has to be aroused awake again. Dr. Jimmye Norman at bedside. [place] reported pt has CHF and pt states he wears 4 L nasal cannula at home. Pt states hx of same symptoms.

## 2016-09-16 NOTE — ED Notes (Signed)
Pt sleeping on stretcher, family at bedside. Informed of bed assigned. No needs at this time.

## 2016-09-16 NOTE — ED Notes (Signed)
[employer] called this RN to report that no new events with pacemakers. States rhythm showed PVC's. Last V tach was in July. No shocks given since last check.

## 2016-09-16 NOTE — ED Notes (Signed)
Admitting at bedside 

## 2016-09-16 NOTE — H&P (Signed)
Edgar Padilla is an 66 y.o. male.   Chief Complaint: almost passing out HPI: this is a 66 year old male who has a history of cardiomyopathy with EF of 25%. He also has pulmonary hypertension. He was at Dr. Vashti Hey office yesterday for consultation for an epidural. No procedure was performed at that time. His wife was over there for an appointment today he accompanied her. We'll try to get out of a chair he felt weak and lightheaded his wife said he turned very pale and almost passed out. He is awake and alert but complains of shoulder pain. He does have a history of pain in the shoulder and cervical spinal issues. In the ER he was found to be in acute renal failure. He says he is a pleasant approaching end-stage heart disease. And that he's got reevaluated next week for a left ventricular assist device somewhere in [place]. His cardiologist is Dr. Geryl Councilman in [place] at [facility] heart and vascular. The extremes no chest pain or palpitations with this. He said his to Rocephin mild was increased a few weeks ago. He also stated he often has trouble with his fluid balance  Past Medical History:  Diagnosis Date  . Arthritis   . CHF (congestive heart failure) ([place])   . Coronary artery disease   . Diabetes mellitus without complication ([place])   . Hypertension   . Myocardial infarction ([place])   . Pulmonary fibrosis ([place])   . Pulmonary nodule   . Renal disorder   . Stroke [facility])    TIA    Past Surgical History:  Procedure Laterality Date  . BACK SURGERY    . CARDIAC SURGERY    . CAROTID STENT Left 11/12/2013  . CORONARY ARTERY BYPASS GRAFT    . EP IMPLANTABLE DEVICE    . heart     Catherization  . SHOULDER ARTHROSCOPY      Family History  Problem Relation Age of Onset  . Heart failure Father   . Heart attack Father   . Rheum arthritis Mother    Social History:  reports that he has never smoked. He has never used smokeless tobacco. He reports that he does not drink alcohol or use  drugs.  Allergies:  Allergies  Allergen Reactions  . Entresto [Sacubitril-Valsartan] Other (See Comments)    Hypotension...  . Ticagrelor Shortness Of Breath    SOB  . Lisinopril     Other reaction(s): Other (See Comments) cough  . Restoril [Temazepam] Other (See Comments)    Altered mental status.   . Statins Other (See Comments)    Myalgias, hypotension     (Not in a hospital admission)  Results for orders placed or performed during the hospital encounter of 09/16/16 (from the past 48 hour(s))  CBC with Differential     Status: Abnormal   Collection Time: 09/16/16  2:14 PM  Result Value Ref Range   WBC 13.2 (H) 3.8 - 10.6 K/uL   RBC 3.06 (L) 4.40 - 5.90 MIL/uL   Hemoglobin 9.5 (L) 13.0 - 18.0 g/dL   HCT 28.0 (L) 40.0 - 52.0 %   MCV 91.6 80.0 - 100.0 fL   MCH 31.1 26.0 - 34.0 pg   MCHC 34.0 32.0 - 36.0 g/dL   RDW 16.3 (H) 11.5 - 14.5 %   Platelets 223 150 - 440 K/uL   Neutrophils Relative % 83 %   Neutro Abs 11.0 (H) 1.4 - 6.5 K/uL   Lymphocytes Relative 7 %   Lymphs Abs 0.9 (  L) 1.0 - 3.6 K/uL   Monocytes Relative 8 %   Monocytes Absolute 1.1 (H) 0.2 - 1.0 K/uL   Eosinophils Relative 2 %   Eosinophils Absolute 0.2 0 - 0.7 K/uL   Basophils Relative 0 %   Basophils Absolute 0.1 0 - 0.1 K/uL  Comprehensive metabolic panel     Status: Abnormal   Collection Time: 09/16/16  2:14 PM  Result Value Ref Range   Sodium 132 (L) 135 - 145 mmol/L   Potassium 4.4 3.5 - 5.1 mmol/L   Chloride 91 (L) 101 - 111 mmol/L   CO2 25 22 - 32 mmol/L   Glucose, Bld 367 (H) 65 - 99 mg/dL   BUN 103 (H) 6 - 20 mg/dL    Comment: RESULTS CONFIRMED BY MANUAL DILUTION   Creatinine, Ser 4.01 (H) 0.61 - 1.24 mg/dL   Calcium 8.3 (L) 8.9 - 10.3 mg/dL   Total Protein 7.2 6.5 - 8.1 g/dL   Albumin 4.1 3.5 - 5.0 g/dL   AST 17 15 - 41 U/L   ALT 12 (L) 17 - 63 U/L   Alkaline Phosphatase 72 38 - 126 U/L   Total Bilirubin 0.7 0.3 - 1.2 mg/dL   GFR calc non Af Amer 14 (L) >60 mL/min   GFR calc Af  Amer 17 (L) >60 mL/min    Comment: (NOTE) The eGFR has been calculated using the CKD EPI equation. This calculation has not been validated in all clinical situations. eGFR's persistently <60 mL/min signify possible Chronic Kidney Disease.    Anion gap 16 (H) 5 - 15  Troponin I     Status: Abnormal   Collection Time: 09/16/16  2:14 PM  Result Value Ref Range   Troponin I 0.03 (HH) <0.03 ng/mL    Comment: CRITICAL RESULT CALLED TO, READ BACK BY AND VERIFIED WITH KATE BUMGARNER 09/16/16 @ 4  [place]    Dg Chest 1 View  Result Date: 09/16/2016 CLINICAL DATA:  Near syncope EXAM: CHEST 1 VIEW COMPARISON:  April 03, 2016 FINDINGS: There is no edema or consolidation. Heart is mildly enlarged with pulmonary vascularity within normal limits. Pacemaker leads are attached the right atrium, right ventricle, and coronary sinus. Patient is status post median sternotomy. No adenopathy. No bone lesions. IMPRESSION: Stable cardiac prominence. Pacemaker lead positions unchanged. No edema or consolidation. Electronically Signed   By: Lowella Grip III M.D.   On: 09/16/2016 15:17    Review of Systems  Constitutional: Negative for fever.  HENT: Negative for hearing loss.   Eyes: Negative for blurred vision.  Respiratory: Negative for cough.   Cardiovascular: Negative for chest pain.  Gastrointestinal: Negative for nausea and vomiting.  Genitourinary: Negative for dysuria.  Musculoskeletal: Negative for myalgias.  Skin: Negative for rash.  Neurological: Negative for dizziness.    Blood pressure 133/67, pulse 75, temperature 98.8 F (37.1 C), temperature source Oral, resp. rate 12, SpO2 99 %. Physical Exam  Constitutional: He is oriented to person, place, and time. He appears well-developed and well-nourished. No distress.  HENT:  Head: Normocephalic and atraumatic.  Mouth/Throat: Oropharynx is clear and moist. No oropharyngeal exudate.  Eyes: Pupils are equal, round, and reactive to light. No  scleral icterus.  Neck: Normal range of motion. Neck supple. No JVD present. No thyromegaly present.  Cardiovascular: Normal rate.   Murmur heard. Respiratory: Effort normal and breath sounds normal. No respiratory distress. He exhibits no tenderness.  GI: Soft. Bowel sounds are normal. He exhibits no mass.  Musculoskeletal:  He exhibits no edema or tenderness.  Neurological: He is alert and oriented to person, place, and time. No cranial nerve deficit.  Skin: Skin is warm and dry.     Assessment/Plan 1. Acute renal failure.he does have stage II chronic renal failure with creatinine a little over 2. However today his creatinine is over 4. Suspect this is from overdiuresis. Going to hold his diuretics and gently hydrate him over. Several hours. Recheck his creatinine in the morning. Renal dose his medications. 2. Presyncope. We'll go and check orthostatics on him. He had an admission similar to this back in May where he was orthostatic from overdiuresis. We'll monitor him on telemetry. 3. Cardiomyopathy. He has an EF of 25%. He is to be evaluated next week and [place] for a left ventricular assist device. 4. Pulmonary hypertension. We'll continue his current medications. 5. CODE STATUS. He wishes to be a DO NOT RESUSCITATE.  Total time spent 45 minutes Baxter Hire, MD 09/16/2016, 3:40 PM

## 2016-09-16 NOTE — ED Notes (Signed)
X-ray at bedside

## 2016-09-16 NOTE — ED Provider Notes (Signed)
[facility] Emergency Department Provider Note       Time seen: ----------------------------------------- 2:16 PM on 09/16/2016 -----------------------------------------     I have reviewed the triage vital signs and the nursing notes.   HISTORY   Chief Complaint Chest Pain and Near Syncope    HPI Edgar Padilla is a 66 y.o. male who presents to the ED for nursing to be. Patient is brought from [facility] with a near syncopal event. Patient states he was placed in a chair never actually passed out but felt like he was going to. Patient went there for chest pain and right shoulder pain and he reports that he fell last week but started having right shoulder pain acutely today.   Past Medical History:  Diagnosis Date  . Arthritis   . CHF (congestive heart failure) ([place])   . Coronary artery disease   . Diabetes mellitus without complication ([place])   . Hypertension   . Myocardial infarction ([place])   . Pulmonary fibrosis ([place])   . Pulmonary nodule   . Renal disorder   . Stroke [facility])    TIA    Patient Active Problem List   Diagnosis Date Noted  . Syncope 05/18/2016  . Near syncope 10/11/2015  . Acute on chronic renal failure ([place]) 10/11/2015  . Dehydration 10/11/2015  . Hypotension 10/11/2015  . Seizure disorder ([place]) 10/11/2015  . Right knee pain 10/11/2015  . Chronic systolic CHF (congestive heart failure) ([place]) 10/11/2015  . Chronic respiratory failure with hypoxia ([place]) 10/11/2015  . Gout attack 10/11/2015  . Renal failure (ARF), acute on chronic (HCC) 10/09/2015  . Carotid stenosis   . HLD (hyperlipidemia)   . Bladder outflow obstruction 11/22/2013  . Chronic kidney disease 11/22/2013  . Diabetic neuropathy ([place]) 11/22/2013  . Diabetic retinitis ([place]) 11/22/2013  . Gout 11/22/2013  . TIA (transient ischemic attack) 11/22/2013  . CVA (cerebral vascular accident) ([place]) 11/22/2013  . Essential (primary) hypertension 11/20/2013  . Sick  sinus syndrome ([place]) 11/02/2013  . Biventricular automatic implantable cardioverter defibrillator in situ 10/11/2013  . Obstructive apnea 08/18/2013  . CCF (congestive cardiac failure) ([place]) 07/24/2013  . Lung mass 05/03/2013  . Body aches 04/30/2013  . Benign prostatic hyperplasia with urinary obstruction 04/30/2013  . Arteriosclerosis of coronary artery 04/30/2013  . Failure respiratory ([place]) 03/22/2013  . Cardiomyopathy, ischemic 03/14/2013  . History of myocardial infarct at age less than 57 years 01/20/2013  . Diabetes ([place]) 07/22/2012  . H/O coronary artery bypass surgery 07/22/2012  . 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 CA of skin 11/26/2011    Past Surgical History:  Procedure Laterality Date  . BACK SURGERY    . CARDIAC SURGERY    . CAROTID STENT Left 11/12/2013  . CORONARY ARTERY BYPASS GRAFT    . EP IMPLANTABLE DEVICE    . heart     Catherization  . SHOULDER ARTHROSCOPY      Allergies Entresto [sacubitril-valsartan]; Ticagrelor; Lisinopril; Restoril [temazepam]; and Statins  Social History Social History  Substance Use Topics  . Smoking status: Never Smoker  . Smokeless tobacco: Never Used  . Alcohol use No    Review of Systems Constitutional: Negative for fever. Eyes: Negative for vision changes ENT:  Negative for congestion, sore throat Cardiovascular: Positive for chest pain Respiratory: Negative for shortness of breath. Gastrointestinal: Negative for abdominal pain, vomiting and diarrhea. Genitourinary: Negative for dysuria. Musculoskeletal: Positive for right shoulder pain Skin: Negative for rash.  Neurological: Negative for headaches, focal weakness or numbness.  All systems negative/normal/unremarkable except as stated in the HPI  ____________________________________________   PHYSICAL EXAM:  VITAL SIGNS: ED Triage Vitals [09/16/16 1408]  Enc Vitals Group     BP      Pulse Rate  81     Resp (!) 21     Temp 98.8 F (37.1 C)     Temp Source Oral     SpO2 92 %     Weight      Height      Head Circumference      Peak Flow      Pain Score 0     Pain Loc      Pain Edu?      Excl. in [place]?     Constitutional: Alert and oriented. Well appearing and in no distress. Eyes: Conjunctivae are normal. Normal extraocular movements. ENT   Head: Normocephalic and atraumatic.   Nose: No congestion/rhinnorhea.   Mouth/Throat: Mucous membranes are moist.   Neck: No stridor. Cardiovascular: Normal rate, regular rhythm. No murmurs, rubs, or gallops. Respiratory: Normal respiratory effort without tachypnea nor retractions. Breath sounds are clear and equal bilaterally. No wheezes/rales/rhonchi. Gastrointestinal: Soft and nontender. Normal bowel sounds Musculoskeletal: Nontender with normal range of motion in extremities. No lower extremity tenderness nor edema. Neurologic:  Normal speech and language. No gross focal neurologic deficits are appreciated.  Skin:  Skin is warm, dry and intact. No rash noted. Psychiatric: Mood and affect are normal. Speech and behavior are normal.  ____________________________________________  EKG: Interpreted by me. AV dual paced rhythm with a rate of 83 bpm  ____________________________________________  ED COURSE:  Pertinent labs & imaging results that were available during my care of the patient were reviewed by me and considered in my medical decision making (see chart for details). Patient presents for near-syncope, we will assess with labs and imaging as indicated.   Procedures ____________________________________________   LABS (pertinent positives/negatives)  Labs Reviewed  CBC WITH DIFFERENTIAL/PLATELET - Abnormal; Notable for the following:       Result Value   WBC 13.2 (*)    RBC 3.06 (*)    Hemoglobin 9.5 (*)    HCT 28.0 (*)    RDW 16.3 (*)    Neutro Abs 11.0 (*)    Lymphs Abs 0.9 (*)    Monocytes Absolute  1.1 (*)    All other components within normal limits  COMPREHENSIVE METABOLIC PANEL - Abnormal; Notable for the following:    Sodium 132 (*)    Chloride 91 (*)    Glucose, Bld 367 (*)    BUN 103 (*)    Creatinine, Ser 4.01 (*)    Calcium 8.3 (*)    ALT 12 (*)    GFR calc non Af Amer 14 (*)    GFR calc Af Amer 17 (*)    Anion gap 16 (*)    All other components within normal limits  TROPONIN I - Abnormal; Notable for the following:    Troponin I 0.03 (*)    All other components within normal limits  URINALYSIS, COMPLETE (UACMP) WITH MICROSCOPIC   ____________________________________________  FINAL ASSESSMENT AND PLAN  Near-syncope, Acute on chronic renal insufficiency  Plan: Patient's labs and imaging were dictated above. Patient had presented for near-syncope which is likely reflective of dehydration. He is currently taking torsemide 60 mg twice a day as well as metolazone. His creatinine is elevated compared to prior. I did gently given some  IV fluids. He would benefit from observation, repeat troponins and decreasing his diuretics for now. I will discuss with the hospitalist for admission.   Earleen Newport, MD   Note: This note was generated in part or whole with voice recognition software. Voice recognition is usually quite accurate but there are transcription errors that can and very often do occur. I apologize for any typographical errors that were not detected and corrected.     Earleen Newport, MD 09/16/16 [phone]

## 2016-09-16 NOTE — ED Notes (Signed)
Date and time results received: 09/16/16 1500   Test: troponon Critical Value: 0.03  Name of Provider Notified: Dr. Jimmye Norman

## 2016-09-16 NOTE — ED Notes (Addendum)
Pt has a dual chamber bivent defibrillator and also has a cardiomems. 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

## 2016-09-17 ENCOUNTER — Observation Stay: Payer: Medicare Other

## 2016-09-17 DIAGNOSIS — N179 Acute kidney failure, unspecified: Secondary | ICD-10-CM | POA: Diagnosis not present

## 2016-09-17 LAB — BASIC METABOLIC PANEL
Anion gap: 13 (ref 5–15)
BUN: 101 mg/dL — AB (ref 6–20)
CALCIUM: 8.1 mg/dL — AB (ref 8.9–10.3)
CO2: 28 mmol/L (ref 22–32)
CREATININE: 3.49 mg/dL — AB (ref 0.61–1.24)
Chloride: 94 mmol/L — ABNORMAL LOW (ref 101–111)
GFR calc non Af Amer: 17 mL/min — ABNORMAL LOW (ref >60)
GFR, EST AFRICAN AMERICAN: 20 mL/min — AB (ref >60)
Glucose, Bld: 245 mg/dL — ABNORMAL HIGH (ref 65–99)
Potassium: 3.8 mmol/L (ref 3.5–5.1)
SODIUM: 135 mmol/L (ref 135–145)

## 2016-09-17 LAB — URINALYSIS, COMPLETE (UACMP) WITH MICROSCOPIC
Bilirubin Urine: NEGATIVE
GLUCOSE, UA: NEGATIVE mg/dL
Hgb urine dipstick: NEGATIVE
Ketones, ur: NEGATIVE mg/dL
Leukocytes, UA: NEGATIVE
Nitrite: NEGATIVE
PH: 5 (ref 5.0–8.0)
Protein, ur: NEGATIVE mg/dL
RBC / HPF: NONE SEEN RBC/hpf (ref 0–5)
Specific Gravity, Urine: 1.01 (ref 1.005–1.030)

## 2016-09-17 LAB — GLUCOSE, CAPILLARY
GLUCOSE-CAPILLARY: 233 mg/dL — AB (ref 65–99)
GLUCOSE-CAPILLARY: 284 mg/dL — AB (ref 65–99)
GLUCOSE-CAPILLARY: 229 mg/dL — AB (ref 65–99)
Glucose-Capillary: 268 mg/dL — ABNORMAL HIGH (ref 65–99)

## 2016-09-17 MED ORDER — CYCLOBENZAPRINE HCL 10 MG PO TABS
10.0000 mg | ORAL_TABLET | Freq: Three times a day (TID) | ORAL | Status: DC | PRN
  Administered 2016-09-17: 10 mg via ORAL
  Filled 2016-09-17: qty 1

## 2016-09-17 MED ORDER — MORPHINE SULFATE (PF) 2 MG/ML IV SOLN
2.0000 mg | INTRAVENOUS | Status: DC | PRN
  Administered 2016-09-17 (×3): 2 mg via INTRAVENOUS
  Filled 2016-09-17 (×3): qty 1

## 2016-09-17 MED ORDER — TROLAMINE SALICYLATE 10 % EX CREA
TOPICAL_CREAM | CUTANEOUS | Status: DC | PRN
  Administered 2016-09-17: 22:00:00 via TOPICAL
  Filled 2016-09-17 (×2): qty 85

## 2016-09-17 MED ORDER — ACETAMINOPHEN 325 MG PO TABS
650.0000 mg | ORAL_TABLET | Freq: Four times a day (QID) | ORAL | Status: DC | PRN
  Administered 2016-09-17 – 2016-09-18 (×2): 650 mg via ORAL
  Filled 2016-09-17 (×2): qty 2

## 2016-09-17 NOTE — Progress Notes (Signed)
[facility] at [place] NAME: Edgar Padilla    MR#:  [id number]  DATE OF BIRTH:  1950/04/03  SUBJECTIVE:   Patient here due to a near syncopal episode and noted to be in acute on chronic renal failure. Creatinine somewhat improved with IV fluid hydration today. Patient moaning and complaining of neck pain but denies any recent trauma.cervical spine x-ray showing no acute pathology patient has had previous neck surgery done.  REVIEW OF SYSTEMS:    Review of Systems  Constitutional: Negative for chills and fever.  HENT: Negative for congestion and tinnitus.   Eyes: Negative for blurred vision and double vision.  Respiratory: Negative for cough, shortness of breath and wheezing.   Cardiovascular: Negative for chest pain, orthopnea and PND.  Gastrointestinal: Negative for abdominal pain, diarrhea, nausea and vomiting.  Genitourinary: Negative for dysuria and hematuria.  Musculoskeletal: Positive for neck pain.  Neurological: Negative for dizziness, sensory change and focal weakness.  All other systems reviewed and are negative.   Nutrition: Carb control Tolerating Diet: Yes Tolerating PT: Eval noted.   DRUG ALLERGIES:   Allergies  Allergen Reactions  . Entresto [Sacubitril-Valsartan] Other (See Comments)    Hypotension...  . Ticagrelor Shortness Of Breath    SOB  . Lisinopril     Other reaction(s): Other (See Comments) cough  . Restoril [Temazepam] Other (See Comments)    Altered mental status.   . Statins Other (See Comments)    Myalgias, hypotension    VITALS:  Blood pressure (!) 147/67, pulse 78, temperature 97.6 F (36.4 C), temperature source Oral, resp. rate 18, height 5\' 10"  (1.778 m), weight 104.6 kg (230 lb 9.6 oz), SpO2 98 %.  PHYSICAL EXAMINATION:   Physical Exam  GENERAL:  66 y.o.-year-old patient lying in bed moaning and complaining of neck pain.  EYES: Pupils equal, round, reactive to light and accommodation. No  scleral icterus. Extraocular muscles intact.  HEENT: Head atraumatic, normocephalic. Oropharynx and nasopharynx clear.  NECK:  Supple, no jugular venous distention. No thyroid enlargement, no tenderness.  LUNGS: Normal breath sounds bilaterally, no wheezing, rales, rhonchi. No use of accessory muscles of respiration.  CARDIOVASCULAR: S1, S2 normal. No murmurs, rubs, or gallops.  ABDOMEN: Soft, nontender, nondistended. Bowel sounds present. No organomegaly or mass.  EXTREMITIES: No cyanosis, clubbing or edema b/l.    NEUROLOGIC: Cranial nerves II through XII are intact. No focal Motor or sensory deficits b/l. Globally weak PSYCHIATRIC: The patient is alert and oriented x 3.  SKIN: No obvious rash, lesion, or ulcer.    LABORATORY PANEL:   CBC  Recent Labs Lab 09/16/16 1414  WBC 13.2*  HGB 9.5*  HCT 28.0*  PLT 223   ------------------------------------------------------------------------------------------------------------------  Chemistries   Recent Labs Lab 09/16/16 1414 09/17/16 0921  NA 132* 135  K 4.4 3.8  CL 91* 94*  CO2 25 28  GLUCOSE 367* 245*  BUN 103* 101*  CREATININE 4.01* 3.49*  CALCIUM 8.3* 8.1*  AST 17  --   ALT 12*  --   ALKPHOS 72  --   BILITOT 0.7  --    ------------------------------------------------------------------------------------------------------------------  Cardiac Enzymes  Recent Labs Lab 09/16/16 1414  TROPONINI 0.03*   ------------------------------------------------------------------------------------------------------------------  RADIOLOGY:  Dg Chest 1 View  Result Date: 09/16/2016 CLINICAL DATA:  Near syncope EXAM: CHEST 1 VIEW COMPARISON:  April 03, 2016 FINDINGS: There is no edema or consolidation. Heart is mildly enlarged with pulmonary vascularity within normal limits. Pacemaker leads are attached the  right atrium, right ventricle, and coronary sinus. Patient is status post median sternotomy. No adenopathy. No bone lesions.  IMPRESSION: Stable cardiac prominence. Pacemaker lead positions unchanged. No edema or consolidation. Electronically Signed   By: Lowella Grip III M.D.   On: 09/16/2016 15:17   Dg Cervical Spine 2 Or 3 Views  Result Date: 09/17/2016 CLINICAL DATA:  Persistent neck pain, fell 1 week ago EXAM: CERVICAL SPINE - 2-3 VIEW COMPARISON:  CT cervical spine of 05/18/2016 FINDINGS: There is little change in reversal of normal cervical spine curvature. Apparent bony fusion at C5-6 is again noted with degenerative disc disease at C6-7 not as well visualized. No acute abnormality is seen. No prevertebral soft tissue swelling is noted. The odontoid process is intact. The lung apices are not well seen but are grossly clear. There is curvature of the cervical vertebrae convex to the right. IMPRESSION: 1. No change in reversal of cervical spine curvature. 2. Bony fusion C5-6 with degenerative disc disease at C6-7. Electronically Signed   By: Ivar Drape M.D.   On: 09/17/2016 10:32     ASSESSMENT AND PLAN:   66 year old male with past medical history of chronic kidney disease stage III, cardiomyopathy with ejection fraction of 64-15%, chronic systolic CHF,history of pulmonary fibrosis/hypertension, history of osteoarthritis presented to the hospital due to near syncopal episode and also noted to be in acute on chronic renal failure.  1. Acute on Chronic renal failure - due to Overdiuresis.  - hold diuretics and cont. Gentle IV fluid hydration and follow BUn/Cr.  - renal dose meds  2. Neck pain - seems chronic in nature but the patient denies it.  - X-ray of C-spine is (-). Cont. Supportive care with pain control. Seen by PT and they recommend SNF but pt. Is under observation status.   3. History of chronic systolic CHF-patient clinically is not in congestive heart failure. - Diuretics on hold due to acute on chronic renal failure. -continue carvedilol, losartan.  4. Essential hypertension-continue  losartan,carvedilol  5. BPH-continue finasteride  6. GERD-continue Protonix.  7. Diabetes type 2 without complication-continue Levemir, sliding scale insulin.  All the records are reviewed and case discussed with Care Management/Social Worker. Management plans discussed with the patient, family and they are in agreement.  CODE STATUS: Full code  DVT Prophylaxis: Hep. SQ  TOTAL TIME TAKING CARE OF THIS PATIENT: 30 minutes.   POSSIBLE D/C IN 1-2 DAYS, DEPENDING ON CLINICAL CONDITION.   Henreitta Leber M.D on 09/17/2016 at 2:47 PM  Between 7am to 6pm - Pager - [phone]  After 6pm go to [email or web address] - Proofreader  [facility]  Office  [phone]  CC: Primary care physician; System, Provider Not In

## 2016-09-17 NOTE — Care Management (Addendum)
Placed in observation for near syncope.  Has chronic kidney disease but there has been an increase in his BUN/Creatinine- thought to be from over diuresis.  Due to heart failure issues with low EF, receiving gentle hydration at 50cc/hr due to potential for fluid overload.  Presents from home.  Has support of his wife and chronic home 02.  Being followed by [facility] for LV[facility]. At baseline, patient fatigues quickly due to his cardiac disease.

## 2016-09-17 NOTE — Care Management Obs Status (Deleted)
MEDICARE OBSERVATION STATUS NOTIFICATION   Patient Details  Name: Jasn Xia MRN: [id number] Date of Birth: [birth year]   Medicare Observation Status Notification Given:    Signed IM notice given    Katrina Stack, RN 09/17/2016, 11:36 AM

## 2016-09-17 NOTE — Progress Notes (Signed)
Inpatient Diabetes Program Recommendations  AACE/ADA: New Consensus Statement on Inpatient Glycemic Control (2015)  Target Ranges:  Prepandial:   less than 140 mg/dL      Peak postprandial:   less than 180 mg/dL (1-2 hours)      Critically ill patients:  140 - 180 mg/dL   Lab Results  Component Value Date   GLUCAP 284 (H) 09/17/2016   HGBA1C 7.0 (H) 05/18/2016    Review of Glycemic ControlResults for GONSALO, CUTHBERTSON (MRN [id number]) as of 09/17/2016 13:21  Ref. Range 09/16/2016 17:32 09/16/2016 20:35 09/17/2016 07:07 09/17/2016 11:08  Glucose-Capillary Latest Ref Range: 65 - 99 mg/dL 415 (H) 326 (H) 233 (H) 284 (H)   Diabetes history: Type 2 DM Outpatient Diabetes medications: Humalog 1-160 units tid with meals, NPH 26 units bid Current orders for Inpatient glycemic control:  Levemir 26 unit bid, Novolog sensitive tid with meals and HS  Inpatient Diabetes Program Recommendations:   Please consider adding Novolog meal coverage 5 units tid with meals (hold if patient eats less than 50%). Also consider increasing Levemir to 30 units bid if fasting blood sugars remain>180 mg/dL.   Thanks, Adah Perl, RN, BC-ADM Inpatient Diabetes Coordinator Pager [phone] (8a-5p)

## 2016-09-17 NOTE — Evaluation (Signed)
Physical Therapy Evaluation Patient Details Name: Edgar Padilla MRN: [id number] DOB: [birth year] Today's Date: 09/17/2016   History of Present Illness  Pt is a 66 year old male who has a history of cardiomyopathy with EF of 25%. He also has pulmonary hypertension. He was at Dr. Vashti Hey office yesterday for consultation for an epidural. No procedure was performed at that time. His wife was over there for an appointment today he accompanied her. While trying to get out of a chair he felt weak and lightheaded his wife said he turned very pale and almost passed out. He is awake and alert but complains of shoulder pain. He does have a history of pain in the shoulder and cervical spinal issues. In the ER he was found to be in acute renal failure. He says he is approaching end-stage heart disease and that he's to be reevaluated next week for a left ventricular assist device somewhere in [place]. His cardiologist is Dr. Geryl Councilman in [place] at [facility] heart and vascular.  Assessment includes: Acute renal failure, presyncope, cardiomyopathy and pulmonary HTN.      Clinical Impression  Pt presents with deficits in strength, transfers, mobility, gait, balance, and activity tolerance.  Pt required Mod A with bed mobility tasks and Min A to stand from elevated EOB.  Slow cadence with gait with RW and CGA without LOB.  Pt fatigued after amb 50' requiring to return to sitting.  BP taken in sitting prior to amb 121/63 mmHg with no adverse symptoms.  No adverse symptoms reported during or after amb other than fatigue.  Deferred stair training secondary to fatigue after short amb.  Pt will benefit from PT services in a SNF setting to safely address above deficits for decreased caregiver assistance and return to PLOF.      Follow Up Recommendations SNF    Equipment Recommendations  None recommended by PT    Recommendations for Other Services       Precautions / Restrictions Precautions Precautions:  Fall Restrictions Weight Bearing Restrictions: No      Mobility  Bed Mobility Overal bed mobility: Needs Assistance Bed Mobility: Sit to Supine;Supine to Sit     Supine to sit: Mod assist Sit to supine: Mod assist      Transfers Overall transfer level: Needs assistance Equipment used: Rolling walker (2 wheeled) Transfers: Sit to/from Stand Sit to Stand: Min assist         General transfer comment: Min A to stand from elevated EOB with min verbal cues for hand placement  Ambulation/Gait Ambulation/Gait assistance: Min guard Ambulation Distance (Feet): 50 Feet Assistive device: Rolling walker (2 wheeled) Gait Pattern/deviations: Step-through pattern;Decreased step length - right;Decreased step length - left   Gait velocity interpretation: Below normal speed for age/gender General Gait Details: Slow cadence with gait without LOB with fatigue after amb 50' requiring to return to sitting  Stairs Stairs:  (deferred)          Wheelchair Mobility    Modified Rankin (Stroke Patients Only)       Balance Overall balance assessment: Needs assistance Sitting-balance support: No upper extremity supported;Feet supported Sitting balance-Leahy Scale: Good     Standing balance support: Bilateral upper extremity supported Standing balance-Leahy Scale: Good                               Pertinent Vitals/Pain Pain Assessment: 0-10 Pain Score: 8  Pain Location: B shoulder, neck, and back pain with  movement Pain Descriptors / Indicators: Aching;Sore Pain Intervention(s): Premedicated before session;Patient requesting pain meds-RN notified;Monitored during session;Limited activity within patient's tolerance    Home Living Family/patient expects to be discharged to:: Private residence Living Arrangements: Spouse/significant other Available Help at Discharge: Family;Available 24 hours/day Type of Home: House Home Access: Stairs to enter Entrance  Stairs-Rails: None Entrance Stairs-Number of Steps: 1 Home Layout: One level Home Equipment: Walker - 4 wheels;Cane - single point;Walker - 2 wheels      Prior Function Level of Independence: Independent with assistive device(s)         Comments: Mod Ind amb with rollator limited community distances, Ind with ADLs, 2 falls in last two weeks      Hand Dominance   Dominant Hand: Right    Extremity/Trunk Assessment   Upper Extremity Assessment Upper Extremity Assessment: Generalized weakness    Lower Extremity Assessment Lower Extremity Assessment: Generalized weakness       Communication   Communication: No difficulties  Cognition Arousal/Alertness: Awake/alert Behavior During Therapy: WFL for tasks assessed/performed Overall Cognitive Status: Within Functional Limits for tasks assessed                                        General Comments      Exercises Total Joint Exercises Ankle Circles/Pumps: AROM;Both;5 reps;10 reps Quad Sets: Strengthening;Both;10 reps Gluteal Sets: Strengthening;Both;10 reps Heel Slides: AROM;Both;5 reps Hip ABduction/ADduction: AROM;Both;5 reps Long Arc Quad: AROM;Both;10 reps Marching in Standing: AROM;Both;10 reps Other Exercises Other Exercises: HEP education for BLE APs, QS, and GS x 10 each 6x/day   Assessment/Plan    PT Assessment Patient needs continued PT services  PT Problem List Decreased strength;Decreased activity tolerance;Decreased balance;Decreased mobility       PT Treatment Interventions DME instruction;Gait training;Stair training;Functional mobility training;Neuromuscular re-education;Balance training;Therapeutic exercise;Therapeutic activities;Patient/family education    PT Goals (Current goals can be found in the Care Plan section)  Acute Rehab PT Goals Patient Stated Goal: To be able to get back out into the community PT Goal Formulation: With patient Time For Goal Achievement:  09/30/16 Potential to Achieve Goals: Good    Frequency Min 2X/week   Barriers to discharge        Co-evaluation               AM-PAC PT "6 Clicks" Daily Activity  Outcome Measure Difficulty turning over in bed (including adjusting bedclothes, sheets and blankets)?: Unable Difficulty moving from lying on back to sitting on the side of the bed? : Unable Difficulty sitting down on and standing up from a chair with arms (e.g., wheelchair, bedside commode, etc,.)?: Unable Help needed moving to and from a bed to chair (including a wheelchair)?: A Little Help needed walking in hospital room?: A Little Help needed climbing 3-5 steps with a railing? : A Lot 6 Click Score: 11    End of Session Equipment Utilized During Treatment: Gait belt;Oxygen Activity Tolerance: Patient limited by fatigue Patient left: in bed;with bed alarm set;with call bell/phone within reach Nurse Communication: Mobility status PT Visit Diagnosis: Muscle weakness (generalized) (M62.81);Difficulty in walking, not elsewhere classified (R26.2)    Time: 1607-3710 PT Time Calculation (min) (ACUTE ONLY): 31 min   Charges:   PT Evaluation $PT Eval Low Complexity: 1 Low PT Treatments $Therapeutic Exercise: 8-22 mins   PT G Codes:   PT G-Codes **NOT FOR INPATIENT CLASS** Functional Assessment Tool Used: AM-PAC  6 Clicks Basic Mobility Functional Limitation: Mobility: Walking and moving around Mobility: Walking and Moving Around Current Status [phone]): At least 60 percent but less than 80 percent impaired, limited or restricted Mobility: Walking and Moving Around Goal Status [phone]): At least 1 percent but less than 20 percent impaired, limited or restricted    D. Scott Jhair Witherington PT, DPT 09/17/16, 1:33 PM

## 2016-09-17 NOTE — Plan of Care (Signed)
Problem: Pain Managment: Goal: General experience of comfort will improve Outcome: Not Progressing Complaints of right shoulder pain when moving at around at all, even turning his head. Treated once with percocet & flexeril. Pt states he uses some "muscle rub" at home.  Problem: Tissue Perfusion: Goal: Risk factors for ineffective tissue perfusion will decrease Outcome: Not Progressing Pt refused his subcutaneous heparin for VTE  Problem: Fluid Volume: Goal: Ability to maintain a balanced intake and output will improve Outcome: Progressing Pt self cath's himself 4 times a day, done once on this shift

## 2016-09-17 NOTE — Care Management Obs Status (Signed)
[facility] NOTIFICATION   Patient Details  Name: Edgar Padilla MRN: [id number] Date of Birth: 07-21-1950   Medicare Observation Status Notification Given:  Yes  Notice signed, one given to patient and the other to HIM for scanning  Katrina Stack, RN 09/17/2016, 11:38 AM

## 2016-09-18 DIAGNOSIS — N179 Acute kidney failure, unspecified: Secondary | ICD-10-CM | POA: Diagnosis not present

## 2016-09-18 LAB — BASIC METABOLIC PANEL
ANION GAP: 11 (ref 5–15)
Anion gap: 12 (ref 5–15)
BUN: 101 mg/dL — ABNORMAL HIGH (ref 6–20)
BUN: 100 mg/dL — AB (ref 6–20)
CALCIUM: 8.1 mg/dL — AB (ref 8.9–10.3)
CALCIUM: 8.2 mg/dL — AB (ref 8.9–10.3)
CHLORIDE: 94 mmol/L — AB (ref 101–111)
CO2: 29 mmol/L (ref 22–32)
CO2: 29 mmol/L (ref 22–32)
CREATININE: 3.11 mg/dL — AB (ref 0.61–1.24)
CREATININE: 3.17 mg/dL — AB (ref 0.61–1.24)
Chloride: 93 mmol/L — ABNORMAL LOW (ref 101–111)
GFR calc Af Amer: 22 mL/min — ABNORMAL LOW (ref >60)
GFR, EST AFRICAN AMERICAN: 22 mL/min — AB (ref >60)
GFR, EST NON AFRICAN AMERICAN: 19 mL/min — AB (ref >60)
GFR, EST NON AFRICAN AMERICAN: 19 mL/min — AB (ref >60)
GLUCOSE: 163 mg/dL — AB (ref 65–99)
Glucose, Bld: 195 mg/dL — ABNORMAL HIGH (ref 65–99)
Potassium: 3.8 mmol/L (ref 3.5–5.1)
Potassium: 3.9 mmol/L (ref 3.5–5.1)
SODIUM: 135 mmol/L (ref 135–145)
Sodium: 133 mmol/L — ABNORMAL LOW (ref 135–145)

## 2016-09-18 LAB — GLUCOSE, CAPILLARY
GLUCOSE-CAPILLARY: 182 mg/dL — AB (ref 65–99)
GLUCOSE-CAPILLARY: 182 mg/dL — AB (ref 65–99)
GLUCOSE-CAPILLARY: 158 mg/dL — AB (ref 65–99)
GLUCOSE-CAPILLARY: 116 mg/dL — AB (ref 65–99)

## 2016-09-18 MED ORDER — INSULIN LISPRO 100 UNIT/ML ~~LOC~~ SOLN: 1.0000 [IU] | Freq: Three times a day (TID) | SUBCUTANEOUS | 11 refills | Status: AC

## 2016-09-18 MED ORDER — INSULIN DETEMIR 100 UNIT/ML ~~LOC~~ SOLN
30.0000 [IU] | Freq: Two times a day (BID) | SUBCUTANEOUS | Status: DC
  Administered 2016-09-19: 30 [IU] via SUBCUTANEOUS
  Filled 2016-09-18 (×3): qty 0.3

## 2016-09-18 MED ORDER — INSULIN ASPART 100 UNIT/ML ~~LOC~~ SOLN
5.0000 [IU] | Freq: Three times a day (TID) | SUBCUTANEOUS | Status: DC
  Administered 2016-09-18 – 2016-09-19 (×3): 5 [IU] via SUBCUTANEOUS
  Filled 2016-09-18 (×3): qty 1

## 2016-09-18 NOTE — Clinical Social Work Note (Signed)
CSW received referral for SNF.  Case discussed with case manager and plan is to discharge home with home health.  CSW to sign off please re-consult if social work needs arise.  Kaushik Maul R. Reneisha Stilley, MSW, LCSWA [phone]  

## 2016-09-18 NOTE — Plan of Care (Signed)
Problem: Pain Managment: Goal: General experience of comfort will improve Outcome: Not Progressing Feels like eyes are "watery" and "heavy" today. Will continue to monitor. Wenda Low [facility]

## 2016-09-18 NOTE — Care Management (Signed)
Ms Kerlin says the agency that provided home health on previous occasion was [facility].  She states that it is anticipated that patient will have open heart surgery sooner than later so is reluctant to accept home health services at present time

## 2016-09-18 NOTE — Care Management (Signed)
Patient requested that his IV fluids be stopped because "I felt like I was getting too much. I hope my doctor does not get mad."  Patient is agreeable with home health services but wants to discuss with his wife.  He has to in and out cath and has concerns that having home health may affect his ability to obtain his supplies.  Provided him with a list of home health agencies and he will discus with his wife.  Had home health  in the past  but does not know the name of the agency but perhaps his wife knows.  CM has left a voicemail for patient's wife and left list of home health providers.  Suggested home health nurse and physical therapy.  Patient's CPC is  Dr Carney Corners  Address: [address], [place], [place] [address] Phone: [phone]

## 2016-09-18 NOTE — Progress Notes (Signed)
[place] [facility] [place] NAME: Edgar Padilla    MR#:  [id number]  DATE OF BIRTH:  06-22-1950  SUBJECTIVE:   Patient here due to a near syncopal episode and noted to be in acute on chronic renal failure. Neck pain has improved.  Cr. Trending down. No other acute events overnight.   REVIEW OF SYSTEMS:    Review of Systems  Constitutional: Negative for chills and fever.  HENT: Negative for congestion and tinnitus.   Eyes: Negative for blurred vision and double vision.  Respiratory: Negative for cough, shortness of breath and wheezing.   Cardiovascular: Negative for chest pain, orthopnea and PND.  Gastrointestinal: Negative for abdominal pain, diarrhea, nausea and vomiting.  Genitourinary: Negative for dysuria and hematuria.  Musculoskeletal: Positive for neck pain.  Neurological: Negative for dizziness, sensory change and focal weakness.  All other systems reviewed and are negative.   Nutrition: Carb control Tolerating Diet: Yes Tolerating PT: Eval noted.   DRUG ALLERGIES:   Allergies  Allergen Reactions  . Entresto [Sacubitril-Valsartan] Other (See Comments)    Hypotension...  . Ticagrelor Shortness Of Breath    SOB  . Lisinopril     Other reaction(s): Other (See Comments) cough  . Restoril [Temazepam] Other (See Comments)    Altered mental status.   . Statins Other (See Comments)    Myalgias, hypotension    VITALS:  Blood pressure (!) 132/52, pulse 88, temperature 97.9 F (36.6 C), temperature source Oral, resp. rate 18, height 5\' 10"  (1.778 m), weight 105.8 kg (233 lb 3.2 oz), SpO2 94 %.  PHYSICAL EXAMINATION:   Physical Exam  GENERAL:  66 y.o.-year-old patient lying in bed moaning and complaining of neck pain.  EYES: Pupils equal, round, reactive to light and accommodation. No scleral icterus. Extraocular muscles intact.  HEENT: Head atraumatic, normocephalic. Oropharynx and nasopharynx clear.  NECK:  Supple, no jugular  venous distention. No thyroid enlargement, no tenderness.  LUNGS: Normal breath sounds bilaterally, no wheezing, rales, rhonchi. No use of accessory muscles of respiration.  CARDIOVASCULAR: S1, S2 normal. No murmurs, rubs, or gallops.  ABDOMEN: Soft, nontender, nondistended. Bowel sounds present. No organomegaly or mass.  EXTREMITIES: No cyanosis, clubbing or edema b/l.    NEUROLOGIC: Cranial nerves II through XII are intact. No focal Motor or sensory deficits b/l. Globally weak PSYCHIATRIC: The patient is alert and oriented x 3.  SKIN: No obvious rash, lesion, or ulcer.    LABORATORY PANEL:   CBC  Recent Labs Lab 09/16/16 1414  WBC 13.2*  HGB 9.5*  HCT 28.0*  PLT 223   ------------------------------------------------------------------------------------------------------------------  Chemistries   Recent Labs Lab 09/16/16 1414  09/18/16 0345  NA 132*  < > 135  K 4.4  < > 3.8  CL 91*  < > 94*  CO2 25  < > 29  GLUCOSE 367*  < > 195*  BUN 103*  < > 101*  CREATININE 4.01*  < > 3.11*  CALCIUM 8.3*  < > 8.1*  AST 17  --   --   ALT 12*  --   --   ALKPHOS 72  --   --   BILITOT 0.7  --   --   < > = values in this interval not displayed. ------------------------------------------------------------------------------------------------------------------  Cardiac Enzymes  Recent Labs Lab 09/16/16 1414  TROPONINI 0.03*   ------------------------------------------------------------------------------------------------------------------  RADIOLOGY:  Dg Cervical Spine 2 Or 3 Views  Result Date: 09/17/2016 CLINICAL DATA:  Persistent neck pain, fell  1 week ago EXAM: CERVICAL SPINE - 2-3 VIEW COMPARISON:  CT cervical spine of 05/18/2016 FINDINGS: There is little change in reversal of normal cervical spine curvature. Apparent bony fusion at C5-6 is again noted with degenerative disc disease at C6-7 not as well visualized. No acute abnormality is seen. No prevertebral soft tissue  swelling is noted. The odontoid process is intact. The lung apices are not well seen but are grossly clear. There is curvature of the cervical vertebrae convex to the right. IMPRESSION: 1. No change in reversal of cervical spine curvature. 2. Bony fusion C5-6 with degenerative disc disease at C6-7. Electronically Signed   By: Ivar Drape M.D.   On: 09/17/2016 10:32     ASSESSMENT AND PLAN:   66 year old male with past medical history of chronic kidney disease stage III, cardiomyopathy with ejection fraction of 99-24%, chronic systolic CHF,history of pulmonary fibrosis/hypertension, history of osteoarthritis presented to the hospital due to near syncopal episode and also noted to be in acute on chronic renal failure.  1. Acute on Chronic renal failure - due to Overdiuresis.  - cont. To hold diuretics and pt. Refusing IV fluids as he feels like he is bloated and filling up with fluid.  - Cr. Trending down and will cont. To monitor.    2. Neck pain - seems chronic in nature but the patient denies it.  - X-ray of C-spine is (-). Cont. Supportive care with pain control. Seen by PT and they recommend SNF but pt. Is under observation status and CM discussed Home Health services but pt and wife refused.   3. History of chronic systolic CHF-patient clinically is not in congestive heart failure. - Diuretics on hold due to acute on chronic renal failure. -continue carvedilol, losartan.  4. Essential hypertension-continue losartan,carvedilol  5. BPH-continue finasteride  6. GERD-continue Protonix.  7. Diabetes type 2 without complication-continue Levemir, sliding scale insulin.  If Cr. Trending down this afternoon will d/c home.   All the records are reviewed and case discussed with Care Management/Social Worker. Management plans discussed with the patient, family and they are in agreement.  CODE STATUS: Full code  DVT Prophylaxis: Hep. SQ  TOTAL TIME TAKING CARE OF THIS PATIENT: 25 minutes.    POSSIBLE D/C IN 1-2 DAYS, DEPENDING ON CLINICAL CONDITION.   Henreitta Leber M.D on 09/18/2016 at 4:00 PM  Between 7am to 6pm - Pager - [phone]  After 6pm go to [email or web address] - Proofreader  [facility]  Office  [phone]  CC: Primary care physician; System, Provider Not In

## 2016-09-19 DIAGNOSIS — N179 Acute kidney failure, unspecified: Secondary | ICD-10-CM | POA: Diagnosis not present

## 2016-09-19 LAB — BASIC METABOLIC PANEL
ANION GAP: 12 (ref 5–15)
BUN: 110 mg/dL — ABNORMAL HIGH (ref 6–20)
CO2: 28 mmol/L (ref 22–32)
Calcium: 8.2 mg/dL — ABNORMAL LOW (ref 8.9–10.3)
Chloride: 94 mmol/L — ABNORMAL LOW (ref 101–111)
Creatinine, Ser: 3.95 mg/dL — ABNORMAL HIGH (ref 0.61–1.24)
GFR calc Af Amer: 17 mL/min — ABNORMAL LOW (ref >60)
GFR, EST NON AFRICAN AMERICAN: 15 mL/min — AB (ref >60)
GLUCOSE: 158 mg/dL — AB (ref 65–99)
POTASSIUM: 4.2 mmol/L (ref 3.5–5.1)
Sodium: 134 mmol/L — ABNORMAL LOW (ref 135–145)

## 2016-09-19 LAB — GLUCOSE, CAPILLARY
GLUCOSE-CAPILLARY: 167 mg/dL — AB (ref 65–99)
GLUCOSE-CAPILLARY: 150 mg/dL — AB (ref 65–99)

## 2016-09-19 LAB — BRAIN NATRIURETIC PEPTIDE: B Natriuretic Peptide: 910 pg/mL — ABNORMAL HIGH (ref 0.0–100.0)

## 2016-09-19 MED ORDER — ORAL CARE MOUTH RINSE: 15.0000 mL | Freq: Two times a day (BID) | OROMUCOSAL | Status: DC

## 2016-09-19 MED ORDER — CHLORHEXIDINE GLUCONATE 0.12 % MT SOLN
15.0000 mL | Freq: Two times a day (BID) | OROMUCOSAL | Status: DC
  Administered 2016-09-19: 15 mL via OROMUCOSAL
  Filled 2016-09-19: qty 15

## 2016-09-19 NOTE — Progress Notes (Signed)
Patient to discharged to [facility]. Pt being transported via EMS.Wife notified about pt being trasported. IV catheter and tele removed and intact. Pt on 4 liters N/C. VSS, no distress noted.

## 2016-09-19 NOTE — Consult Note (Signed)
[facility]  CONSULT NOTE    Date: 09/19/2016                  Patient Name:  Edgar Padilla  MRN: [id number]  DOB: Sep 20, 1950  Age / Sex: 66 y.o., male         PCP: System, Provider Not In                 Service Requesting Consult: Dr. Verdell Carmine                 Reason for Consult: Acute renal failure on chronic kidney disease stage IV            History of Present Illness: Edgar Padilla is a 66 y.o. white male with systolic congestive heart failure, hypertension, depression, BPH, diabetes mellitus type II insulin dependent, diabetic neuropathy, anemia, CVA, pulmonary fibrosis, coronary artery disease, arthritis with chronic pain, who was admitted to [facility] on 09/16/2016 for Near syncope [R55] Acute renal failure superimposed on stage 5 chronic kidney disease, not on chronic dialysis, unspecified acute renal failure type ([place]) [N17.9, N18.5]   Patient very lethargic and not able arousable. Claims to feel fluid overloaded with shortness of breath, itchy skin, fatigue and nausea.   History taken with assistance of wife over the phone.   Creatinine on admission of 4.1 and was given IV fluids. However creatinine and BUN have not improved. Nephrology consulted. Patient follows with outpatient nephrology at [facility], Dr. Sharalyn Ink.   No IV contrast exposure. Patient denies use of NSAIDs.   Currently holding all diuretics.   Medications: Outpatient medications: Prescriptions Prior to Admission  Medication Sig Dispense Refill Last Dose  . aspirin EC 81 MG tablet Take 81 mg by mouth every morning.    09/16/2016 at 0800  . carvedilol (COREG) 6.25 MG tablet Take 6.25 mg by mouth 2 (two) times daily with a meal.    09/16/2016 at 0800  . clopidogrel (PLAVIX) 75 MG tablet Take 75 mg by mouth daily.   09/16/2016 at 0800  . escitalopram (LEXAPRO) 20 MG tablet Take 20 mg by mouth every morning.    09/16/2016 at 0800  . finasteride (PROSCAR) 5 MG tablet Take 5 mg by mouth daily.    09/16/2016 at 0800  . gabapentin (NEURONTIN) 300 MG capsule Take 300 mg by mouth 3 (three) times daily.    09/16/2016 at 0800  . insulin NPH Human (HUMULIN N,NOVOLIN N) 100 UNIT/ML injection Inject 0.2 mLs (20 Units total) into the skin 2 (two) times daily. *Patient uses Humulin N* (Patient taking differently: Inject 26 Units into the skin 2 (two) times daily. *Patient uses Humulin N*) 10 mL 11 09/16/2016 at 0800  . losartan (COZAAR) 25 MG tablet Take 25 mg by mouth daily.   09/16/2016 at 0800  . metolazone (ZAROXOLYN) 2.5 MG tablet Take 2.5 mg by mouth every Monday.    Past Week at 0800  . omeprazole (PRILOSEC) 40 MG capsule Take 40 mg by mouth every morning.    09/16/2016 at 0800  . torsemide (DEMADEX) 20 MG tablet Take 60 mg by mouth 2 (two) times daily. Pt takes 60 mg bid   09/16/2016 at 0800  . acetaminophen (TYLENOL) 500 MG tablet Take 1,500 mg by mouth at bedtime.   prn at prn  . colchicine 0.6 MG tablet Take 0.6 mg by mouth daily as needed.   prn at prn  . cyclobenzaprine (FLEXERIL) 10 MG tablet Take 10 mg by  mouth every 12 (twelve) hours as needed for muscle spasms.   prn at prn  . docusate sodium (COLACE) 100 MG capsule Take 100 mg by mouth daily as needed for mild constipation.   prn at prn  . nitroGLYCERIN (NITROSTAT) 0.4 MG SL tablet Place 0.4 mg under the tongue every 5 (five) minutes as needed for chest pain.   prn at prn  . oxyCODONE-acetaminophen (PERCOCET) 7.5-325 MG tablet Take 1 tablet by mouth See admin instructions. Take 1 tablet by mouth every 6 to 8 hours as needed for pain.  0 prn at prn  . [DISCONTINUED] insulin lispro (HUMALOG) 100 UNIT/ML injection Inject 1-160 Units into the skin 3 (three) times daily before meals. *Sliding scale and carb count*   prn at prn    Current medications: Current Facility-Administered Medications  Medication Dose Route Frequency Provider Last Rate Last Dose  . acetaminophen (TYLENOL) tablet 650 mg  650 mg Oral Q6H PRN Henreitta Leber, MD   650 mg at  09/18/16 0806  . aspirin EC tablet 81 mg  81 mg Oral Charolett Bumpers, MD   81 mg at 09/19/16 1610  . carvedilol (COREG) tablet 6.25 mg  6.25 mg Oral BID WC Baxter Hire, MD   6.25 mg at 09/19/16 0818  . chlorhexidine (PERIDEX) 0.12 % solution 15 mL  15 mL Mouth Rinse BID Henreitta Leber, MD   15 mL at 09/19/16 0937  . clopidogrel (PLAVIX) tablet 75 mg  75 mg Oral Daily Baxter Hire, MD   75 mg at 09/19/16 9604  . cyclobenzaprine (FLEXERIL) tablet 10 mg  10 mg Oral TID PRN Henreitta Leber, MD   10 mg at 09/17/16 1355  . docusate sodium (COLACE) capsule 100 mg  100 mg Oral Daily PRN Baxter Hire, MD      . escitalopram [facility] 2) tablet 20 mg  20 mg Oral Charolett Bumpers, MD   20 mg at 09/19/16 0610  . finasteride (PROSCAR) tablet 5 mg  5 mg Oral Daily Baxter Hire, MD   5 mg at 09/19/16 5409  . gabapentin (NEURONTIN) capsule 300 mg  300 mg Oral TID Baxter Hire, MD   300 mg at 09/19/16 0937  . heparin injection 5,000 Units  5,000 Units Subcutaneous Q8H Baxter Hire, MD   5,000 Units at 09/18/16 1332  . insulin aspart (novoLOG) injection 0-5 Units  0-5 Units Subcutaneous QHS Baxter Hire, MD   3 Units at 09/17/16 2145  . insulin aspart (novoLOG) injection 0-9 Units  0-9 Units Subcutaneous TID WC Baxter Hire, MD   2 Units at 09/19/16 0820  . insulin aspart (novoLOG) injection 5 Units  5 Units Subcutaneous TID WC Henreitta Leber, MD   5 Units at 09/19/16 0818  . insulin detemir (LEVEMIR) injection 30 Units  30 Units Subcutaneous BID Henreitta Leber, MD   30 Units at 09/19/16 [phone]  . MEDLINE mouth rinse  15 mL Mouth Rinse q12n4p Henreitta Leber, MD      . nitroGLYCERIN (NITROSTAT) SL tablet 0.4 mg  0.4 mg Sublingual Q5 min PRN Baxter Hire, MD      . ondansetron [facility]) tablet 4 mg  4 mg Oral Q6H PRN Baxter Hire, MD   4 mg at 09/18/16 1009   Or  . ondansetron (ZOFRAN) injection 4 mg  4 mg Intravenous Q6H PRN Baxter Hire, MD   4 mg at  09/19/16 0610  .  oxyCODONE-acetaminophen (PERCOCET) 7.5-325 MG per tablet 1 tablet  1 tablet Oral Q6H PRN Baxter Hire, MD   1 tablet at 09/19/16 [phone]  . pantoprazole (PROTONIX) EC tablet 40 mg  40 mg Oral Daily Baxter Hire, MD   40 mg at 09/19/16 2993  . trolamine salicylate (ASPERCREME) 10 % cream   Topical PRN Hillary Bow, MD          Allergies: Allergies  Allergen Reactions  . Entresto [Sacubitril-Valsartan] Other (See Comments)    Hypotension...  . Ticagrelor Shortness Of Breath    SOB  . Lisinopril     Other reaction(s): Other (See Comments) cough  . Restoril [Temazepam] Other (See Comments)    Altered mental status.   . Statins Other (See Comments)    Myalgias, hypotension      Past Medical History: Past Medical History:  Diagnosis Date  . Arthritis   . CHF (congestive heart failure) ([place])   . Coronary artery disease   . Diabetes mellitus without complication ([place])   . Hypertension   . Myocardial infarction ([place])   . Pulmonary fibrosis ([place])   . Pulmonary nodule   . Renal disorder   . Stroke [facility])    TIA     Past Surgical History: Past Surgical History:  Procedure Laterality Date  . BACK SURGERY    . CARDIAC SURGERY    . CAROTID STENT Left 11/12/2013  . CORONARY ARTERY BYPASS GRAFT    . EP IMPLANTABLE DEVICE    . heart     Catherization  . SHOULDER ARTHROSCOPY       Family History: Family History  Problem Relation Age of Onset  . Heart failure Father   . Heart attack Father   . Rheum arthritis Mother      Social History: Social History   Social History  . Marital status: Married    Spouse name: N/A  . Number of children: N/A  . Years of education: N/A   Occupational History  . Not on file.   Social History Main Topics  . Smoking status: Never Smoker  . Smokeless tobacco: Never Used  . Alcohol use No  . Drug use: No  . Sexual activity: Not on file   Other Topics Concern  . Not on file   Social History Narrative   . No narrative on file     Review of Systems: Review of Systems  Unable to perform ROS: Medical condition    Vital Signs: Blood pressure (!) 112/54, pulse 82, temperature 97.8 F (36.6 C), temperature source Oral, resp. rate 20, height 5\' 10"  (7.169 m), weight 106.8 kg (235 lb 6.4 oz), SpO2 96 %.  Weight trends: Filed Weights   09/17/16 0443 09/18/16 0500 09/19/16 0500  Weight: 104.6 kg (230 lb 9.6 oz) 105.8 kg (233 lb 3.2 oz) 106.8 kg (235 lb 6.4 oz)    Physical Exam: General: Ill appearing, laying in bed  Head: Normocephalic, atraumatic. Moist oral mucosal membranes  Eyes: Anicteric, PERRL  Neck: Supple, trachea midline  Lungs:  Crackles bilaterally  Heart: Regular rate and rhythm  Abdomen:  Soft, distended   Extremities: trace peripheral edema.  Neurologic: Lethargic, able to be aroused and answer questions appropriately  Skin: No lesions  Access: none     Lab results: Basic Metabolic Panel:  Recent Labs Lab 09/18/16 0345 09/18/16 1514 09/19/16 0600  NA 135 133* 134*  K 3.8 3.9 4.2  CL 94* 93* 94*  CO2 29 29 28  GLUCOSE 195* 163* 158*  BUN 101* 100* 110*  CREATININE 3.11* 3.17* 3.95*  CALCIUM 8.1* 8.2* 8.2*    Liver Function Tests:  Recent Labs Lab 09/16/16 1414  AST 17  ALT 12*  ALKPHOS 72  BILITOT 0.7  PROT 7.2  ALBUMIN 4.1   No results for input(s): LIPASE, AMYLASE in the last 168 hours. No results for input(s): AMMONIA in the last 168 hours.  CBC:  Recent Labs Lab 09/16/16 1414  WBC 13.2*  NEUTROABS 11.0*  HGB 9.5*  HCT 28.0*  MCV 91.6  PLT 223    Cardiac Enzymes:  Recent Labs Lab 09/16/16 1414  TROPONINI 0.03*    BNP: Invalid input(s): POCBNP  CBG:  Recent Labs Lab 09/18/16 0734 09/18/16 1058 09/18/16 1600 09/18/16 2120 09/19/16 0741  GLUCAP 182* 182* 158* 116* 167*    Microbiology: No results found for this or any previous visit.  Coagulation Studies: No results for input(s): LABPROT, INR in the last  72 hours.  Urinalysis:  Recent Labs  09/17/16 0720  COLORURINE YELLOW*  LABSPEC 1.010  PHURINE 5.0  GLUCOSEU NEGATIVE  HGBUR NEGATIVE  BILIRUBINUR NEGATIVE  KETONESUR NEGATIVE  PROTEINUR NEGATIVE  NITRITE NEGATIVE  LEUKOCYTESUR NEGATIVE      Imaging:  No results found.   Assessment & Plan: Edgar Padilla is a 66 y.o. white male with systolic congestive heart failure, hypertension, depression, BPH, diabetes mellitus type II insulin dependent, diabetic neuropathy, anemia, CVA, pulmonary fibrosis, coronary artery disease, arthritis with chronic pain, who was admitted to [facility] on 09/16/2016 for Near syncope [R55] Acute renal failure superimposed on stage 5 chronic kidney disease, not on chronic dialysis, unspecified acute renal failure type ([place]) [N17.9, N18.5]   1. Acute renal failure on chronic kidney disease stage IV with proteinuria. Creatinine 2.37, GFR of 27 on 5/8.  - Acute renal failure from acute cardiorenal syndrome. Patient seems to be uremic with uremic symptoms. Discussed dialysis with patient and wife.  - Recommend checking renal ultrasound for obstruction.  - hold losartan  2. Acute exacerbation of  Systolic congestive heart failure: echo from 4/56/25 with systolic 63-89%.  May need inotropic agents. Discussed case with cardiology.  Currently holding diuretics.   3. Diabetes mellitus type II with chronic kidney disease: urinalysis with proteinuria and glucosuria. Insulin dependent. Hemoglobin A1c 7%    LOS: 0 Brody Kump 9/8/201811:25 AM

## 2016-09-19 NOTE — Progress Notes (Signed)
Chaplain received a page that the patient needed to see a Chaplain before he leaves. Chaplain went to speak with the patient and the patient said he was going to see the Edgar Padilla and could I call and tell his Edgar Padilla. Me and the patients nurse informed the patient that we could not make that call to his pastor and say that message. I prayed for Edgar Padilla and he said he was fine and his wife and they all ready know he only has a little time left and he was going to heaven before I got off work.

## 2016-09-19 NOTE — Discharge Summary (Signed)
[place] [facility] [place] NAME: Edgar Padilla    MR#:  [id number]  DATE OF BIRTH:  1950-03-16  DATE OF ADMISSION:  09/16/2016 ADMITTING PHYSICIAN: Baxter Hire, MD  DATE OF DISCHARGE: 09/19/2016  PRIMARY CARE PHYSICIAN: System, Provider Not In    ADMISSION DIAGNOSIS:  Near syncope [R55] Acute renal failure superimposed on stage 5 chronic kidney disease, not on chronic dialysis, unspecified acute renal failure type ([place]) [N17.9, N18.5]  DISCHARGE DIAGNOSIS:  Active Problems:   Acute renal failure (ARF) ([place])   SECONDARY DIAGNOSIS:   Past Medical History:  Diagnosis Date  . Arthritis   . CHF (congestive heart failure) ([place])   . Coronary artery disease   . Diabetes mellitus without complication ([place])   . Hypertension   . Myocardial infarction ([place])   . Pulmonary fibrosis ([place])   . Pulmonary nodule   . Renal disorder   . Stroke [facility])    TIA    HOSPITAL COURSE:   66 year old male with past medical history of chronic kidney disease stage III, cardiomyopathy with ejection fraction of 62-70%, chronic systolic CHF,history of pulmonary fibrosis/hypertension, history of osteoarthritis presented to the hospital due to near syncopal episode and also noted to be in acute on chronic renal failure.  1. Acute on Chronic renal failure - initially on admission this was thought to be secondary to overdiuresis. Patient's diuretics were held and he was given IV fluids and his creatinine started to improve but over the past 24 hours patient's creatinine has now increased to close to 4 again. -I suspect this is cardiorenal in nature given his severe cardiomyopathy. Patient would benefit from being on a milrinone or dobutamine infusion with some diuretics. This was discussed with the patient and his wife and since the patient is extensively followed up at [facility] for his heart failure would prefer for him to be transferred there. -Spoke with the transfer  center at [place] and they have accepted the patient's transfer.  2. Neck pain - seems chronic in nature. - X-ray of C-spine is (-) during hospitalization.. Cont. Supportive care with pain control.   3. History of chronic systolic CHF- pt. Presently appears to be in mild CHF and needs to be diuresed with help of Inotropic medications or possibly need for HD.  - will need Cardiology, Renal Consults upon transfer to [place].  -continue carvedilol, losartan.  4. Essential hypertension- pt. Will continue losartan,carvedilol  5. BPH- pt. Will continue finasteride  6. GERD- pt. Will resume His Omeprazole.   7. Diabetes type 2 without complication- while in the hospital here pt. Was maintained on Levemir and SSI and his BS remained. Stable.   Pt. Is being transferred to [facility] for further care.   DISCHARGE CONDITIONS:   Stable.   CONSULTS OBTAINED:  Treatment Team:  Lavonia Dana, MD  DRUG ALLERGIES:   Allergies  Allergen Reactions  . Entresto [Sacubitril-Valsartan] Other (See Comments)    Hypotension...  . Ticagrelor Shortness Of Breath    SOB  . Lisinopril     Other reaction(s): Other (See Comments) cough  . Restoril [Temazepam] Other (See Comments)    Altered mental status.   . Statins Other (See Comments)    Myalgias, hypotension    DISCHARGE MEDICATIONS:   Allergies as of 09/19/2016      Reactions   Entresto [sacubitril-valsartan] Other (See Comments)   Hypotension...   Ticagrelor Shortness Of Breath   SOB   Lisinopril  Other reaction(s): Other (See Comments) cough   Restoril [temazepam] Other (See Comments)   Altered mental status.   Statins Other (See Comments)   Myalgias, hypotension      Medication List    STOP taking these medications   metolazone 2.5 MG tablet Commonly known as:  ZAROXOLYN     TAKE these medications   acetaminophen 500 MG tablet Commonly known as:  TYLENOL Take 1,500 mg by mouth at bedtime.   aspirin EC 81 MG  tablet Take 81 mg by mouth every morning.   carvedilol 6.25 MG tablet Commonly known as:  COREG Take 6.25 mg by mouth 2 (two) times daily with a meal.   clopidogrel 75 MG tablet Commonly known as:  PLAVIX Take 75 mg by mouth daily.   colchicine 0.6 MG tablet Take 0.6 mg by mouth daily as needed.   cyclobenzaprine 10 MG tablet Commonly known as:  FLEXERIL Take 10 mg by mouth every 12 (twelve) hours as needed for muscle spasms.   docusate sodium 100 MG capsule Commonly known as:  COLACE Take 100 mg by mouth daily as needed for mild constipation.   escitalopram 20 MG tablet Commonly known as:  LEXAPRO Take 20 mg by mouth every morning.   finasteride 5 MG tablet Commonly known as:  PROSCAR Take 5 mg by mouth daily.   gabapentin 300 MG capsule Commonly known as:  NEURONTIN Take 300 mg by mouth 3 (three) times daily.   insulin lispro 100 UNIT/ML injection Commonly known as:  HUMALOG Inject 0.01-0.1 mLs (1-10 Units total) into the skin 3 (three) times daily before meals. *Sliding scale and carb count* What changed:  how much to take   insulin NPH Human 100 UNIT/ML injection Commonly known as:  HUMULIN N,NOVOLIN N Inject 0.2 mLs (20 Units total) into the skin 2 (two) times daily. *Patient uses Humulin N* What changed:  how much to take  additional instructions   losartan 25 MG tablet Commonly known as:  COZAAR Take 25 mg by mouth daily.   nitroGLYCERIN 0.4 MG SL tablet Commonly known as:  NITROSTAT Place 0.4 mg under the tongue every 5 (five) minutes as needed for chest pain.   omeprazole 40 MG capsule Commonly known as:  PRILOSEC Take 40 mg by mouth every morning.   oxyCODONE-acetaminophen 7.5-325 MG tablet Commonly known as:  PERCOCET Take 1 tablet by mouth See admin instructions. Take 1 tablet by mouth every 6 to 8 hours as needed for pain.   torsemide 20 MG tablet Commonly known as:  DEMADEX Take 60 mg by mouth 2 (two) times daily. Pt takes 60 mg bid             Discharge Care Instructions        Start     Ordered   09/19/16 0000  Activity as tolerated - No restrictions     09/19/16 1106   09/19/16 0000  Diet - low sodium heart healthy     09/19/16 1106   09/18/16 0000  insulin lispro (HUMALOG) 100 UNIT/ML injection  3 times daily before meals     09/18/16 1547        DISCHARGE INSTRUCTIONS:   DIET:  Cardiac diet, Diabetic diet and Renal diet  DISCHARGE CONDITION:  Stable  ACTIVITY:  Activity as tolerated  OXYGEN:  Home Oxygen: No.   Oxygen Delivery: room air  DISCHARGE LOCATION:  [place] [facility].    If you experience worsening of your admission symptoms, develop shortness of breath,  life threatening emergency, suicidal or homicidal thoughts you must seek medical attention immediately by calling 911 or calling your MD immediately  if symptoms less severe.  You Must read complete instructions/literature along with all the possible adverse reactions/side effects for all the Medicines you take and that have been prescribed to you. Take any new Medicines after you have completely understood and accpet all the possible adverse reactions/side effects.   Please note  You were cared for by a hospitalist during your hospital stay. If you have any questions about your discharge medications or the care you received while you were in the hospital after you are discharged, you can call the unit and asked to speak with the hospitalist on call if the hospitalist that took care of you is not available. Once you are discharged, your primary care physician will handle any further medical issues. Please note that NO REFILLS for any discharge medications will be authorized once you are discharged, as it is imperative that you return to your primary care physician (or establish a relationship with a primary care physician if you do not have one) for your aftercare needs so that they can reassess your need for medications and monitor your lab  values.     Today   Neck Pain improved.  Feels bloated in his abdomen.  Feels a bit short of breath.   VITAL SIGNS:  Blood pressure (!) 112/54, pulse 82, temperature 97.8 F (36.6 C), temperature source Oral, resp. rate 20, height 5\' 10"  (1.778 m), weight 106.8 kg (235 lb 6.4 oz), SpO2 96 %.  I/O:   Intake/Output Summary (Last 24 hours) at 09/19/16 1108 Last data filed at 09/19/16 1009  Gross per 24 hour  Intake           1522.5 ml  Output              450 ml  Net           1072.5 ml    PHYSICAL EXAMINATION:   GENERAL:  66 y.o.-year-old patient lying in bed moaning and complaining of neck pain.  EYES: Pupils equal, round, reactive to light and accommodation. No scleral icterus. Extraocular muscles intact.  HEENT: Head atraumatic, normocephalic. Oropharynx and nasopharynx clear.  NECK:  Supple, no jugular venous distention. No thyroid enlargement, no tenderness.  LUNGS: Normal breath sounds bilaterally, no wheezing, bibasilar rales, rhonchi. No use of accessory muscles of respiration.  CARDIOVASCULAR: S1, S2 normal. No murmurs, rubs, or gallops.  ABDOMEN: Soft, nontender, distended. Bowel sounds present. No organomegaly or mass.  EXTREMITIES: No cyanosis, clubbing or edema b/l.    NEUROLOGIC: Cranial nerves II through XII are intact. No focal Motor or sensory deficits b/l. Globally weak PSYCHIATRIC: The patient is alert and oriented x 3.  SKIN: No obvious rash, lesion, or ulcer.   DATA REVIEW:   CBC  Recent Labs Lab 09/16/16 1414  WBC 13.2*  HGB 9.5*  HCT 28.0*  PLT 223    Chemistries   Recent Labs Lab 09/16/16 1414  09/19/16 0600  NA 132*  < > 134*  K 4.4  < > 4.2  CL 91*  < > 94*  CO2 25  < > 28  GLUCOSE 367*  < > 158*  BUN 103*  < > 110*  CREATININE 4.01*  < > 3.95*  CALCIUM 8.3*  < > 8.2*  AST 17  --   --   ALT 12*  --   --   ALKPHOS 72  --   --  BILITOT 0.7  --   --   < > = values in this interval not displayed.  Cardiac Enzymes  Recent  Labs Lab 09/16/16 1414  TROPONINI 0.03*    Microbiology Results  No results found for this or any previous visit.  RADIOLOGY:  No results found.    Management plans discussed with the patient, family and they are in agreement.  CODE STATUS:     Code Status Orders        Start     Ordered   09/16/16 1657  Do not attempt resuscitation (DNR)  Continuous    Question Answer Comment  In the event of cardiac or respiratory ARREST Do not call a "code blue"   In the event of cardiac or respiratory ARREST Do not perform Intubation, CPR, defibrillation or ACLS   In the event of cardiac or respiratory ARREST Use medication by any route, position, wound care, and other measures to relive pain and suffering. May use oxygen, suction and manual treatment of airway obstruction as needed for comfort.      09/16/16 1656    TOTAL TIME TAKING CARE OF THIS PATIENT: 40 minutes.    Henreitta Leber M.D on 09/19/2016 at 11:08 AM  Between 7am to 6pm - Pager - [phone]  After 6pm go to [email or web address] - Proofreader  [facility] [place] [facility]  Office  [phone]  CC: Primary care physician; System, Provider Not In

## 2016-11-12 DEATH — deceased

## 2017-07-25 IMAGING — CT CT CERVICAL SPINE W/O CM
3 of 7 series · 11 of 33 positions shown, 13 images · non-contrast
Comparison: CT HEAD June 06, [address] cervical spine January 31, 2015

CLINICAL DATA: Fell twice tonight, syncope. History of
hypertension, stroke.

EXAM:
CT HEAD WITHOUT CONTRAST
CT CERVICAL SPINE WITHOUT CONTRAST
TECHNIQUE: Multidetector CT imaging of the head and cervical spine was
performed following the standard protocol without intravenous
contrast. Multiplanar CT image reconstructions of the cervical spine
were also generated.

[Series 8: sagittal bone · sagittal · 0.23mm/px · 5 of 70 slices shown]
[im 12/70  bone]
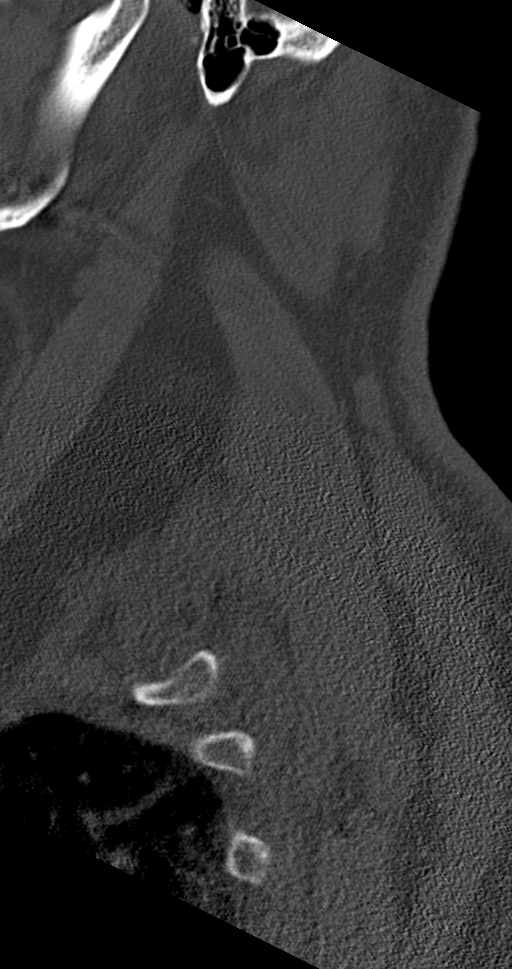
[im 24/70  bone]
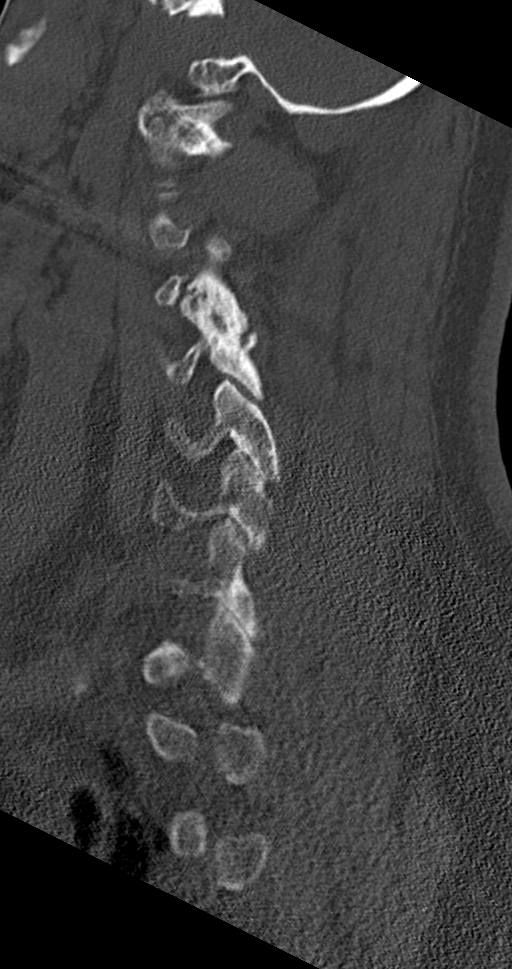
[im 35/70  bone]
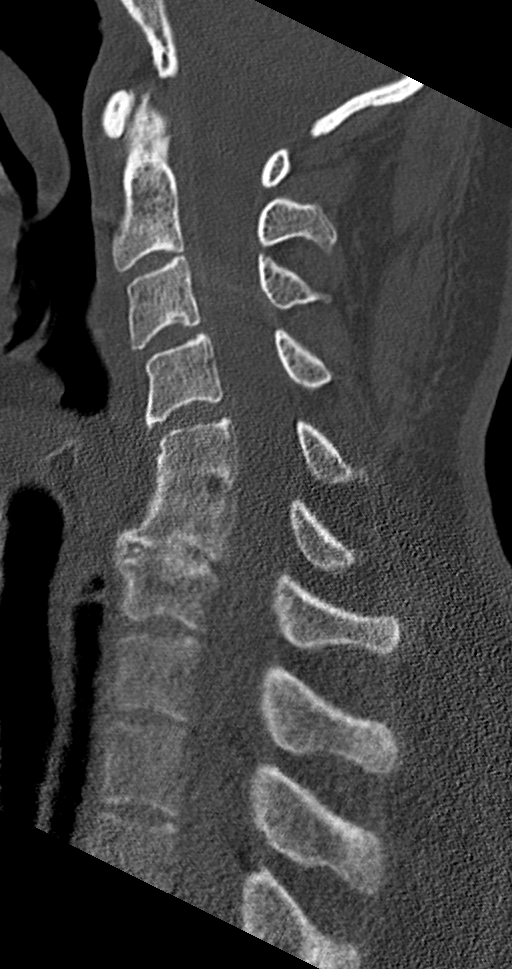
[im 47/70  bone]
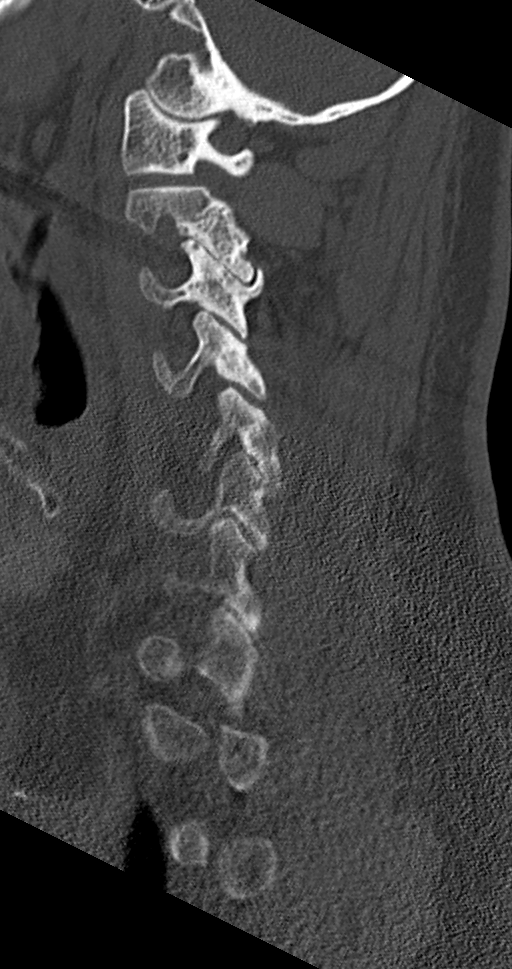
[im 58/70  bone]
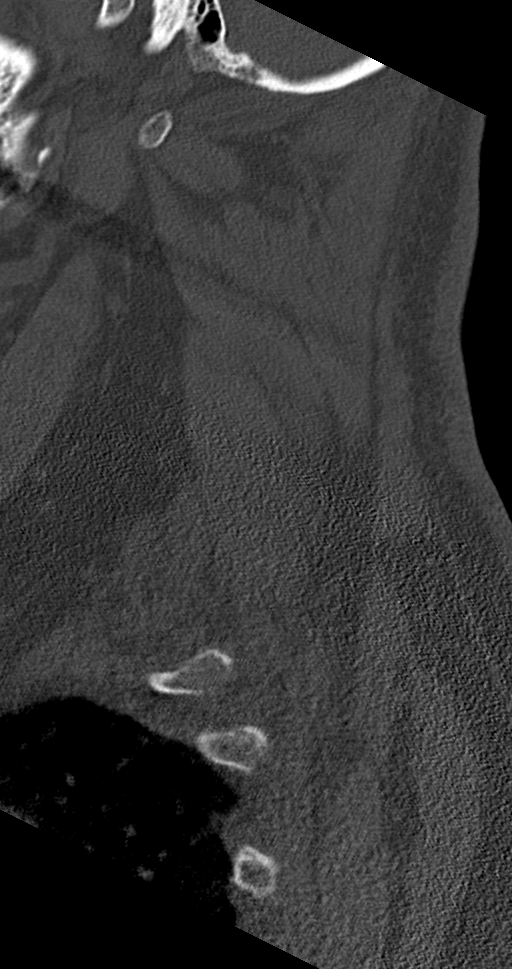

[Series 9: coronal bone · coronal · 0.27mm/px · 1 of 61 slices shown]
[im 31/61  bone]
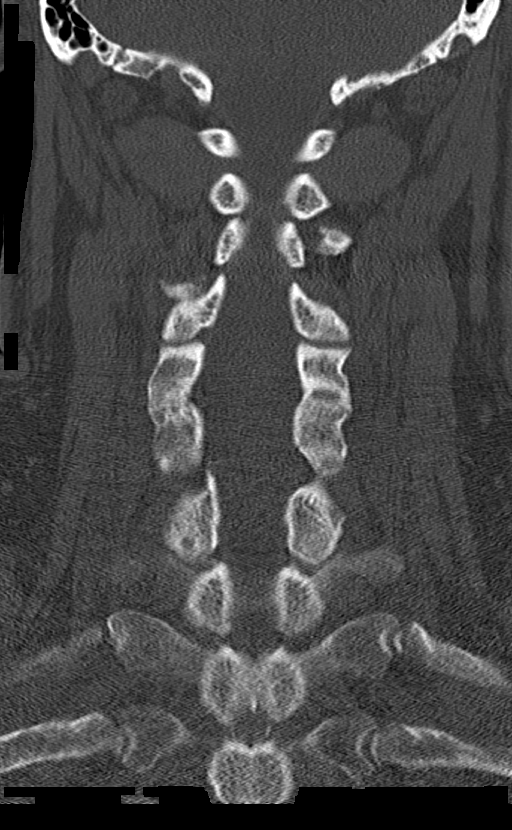

[Series 10: orthogonal bone · axial · 0.23mm/px · z∈[-359,-223]mm · 5 of 114 slices shown, 7 images]
[im 19/114  soft-tissue]
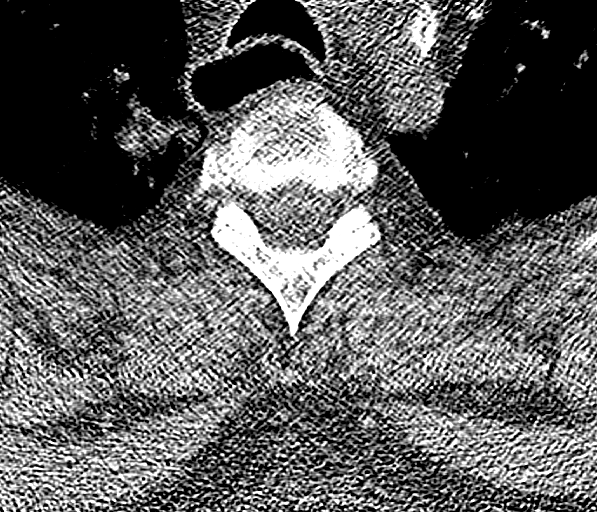
[im 19/114  bone]
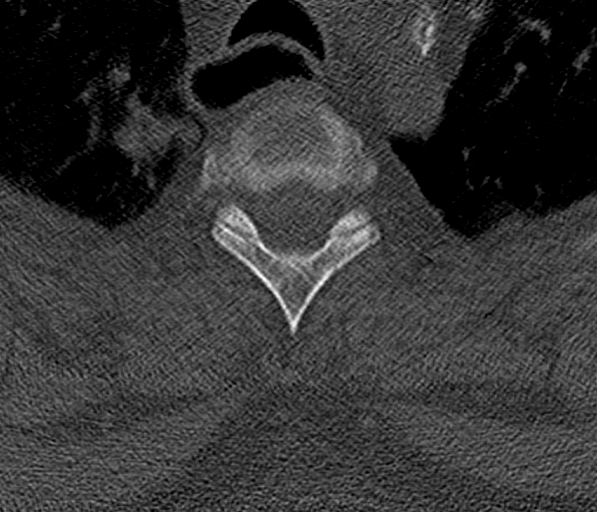
[im 38/114  bone]
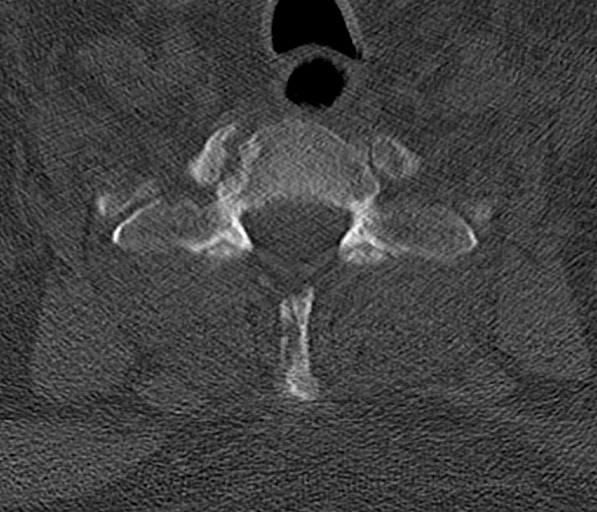
[im 57/114  bone]
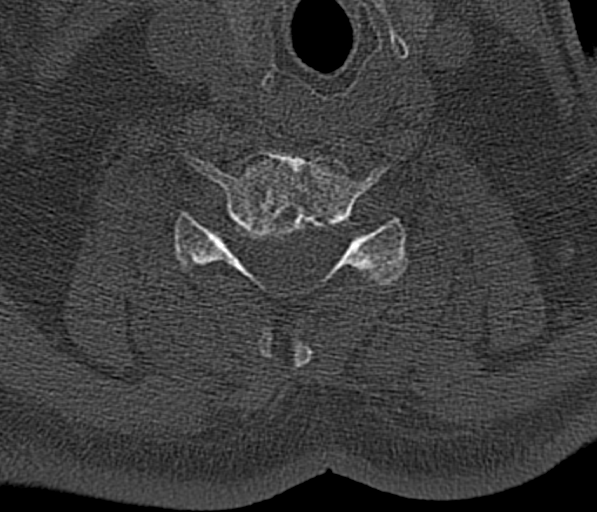
[im 76/114  bone]
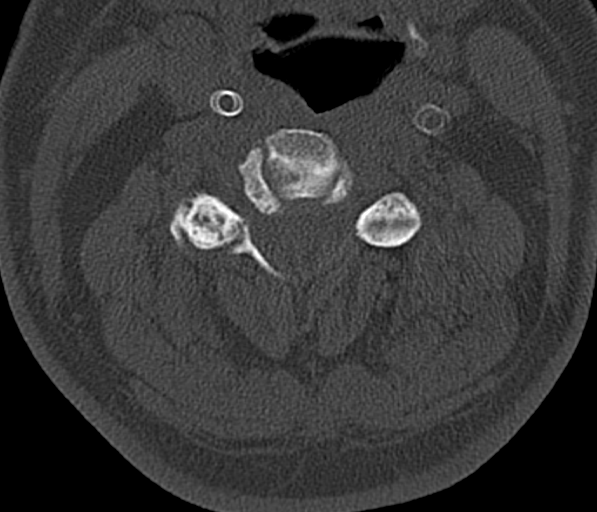
[im 95/114  soft-tissue]
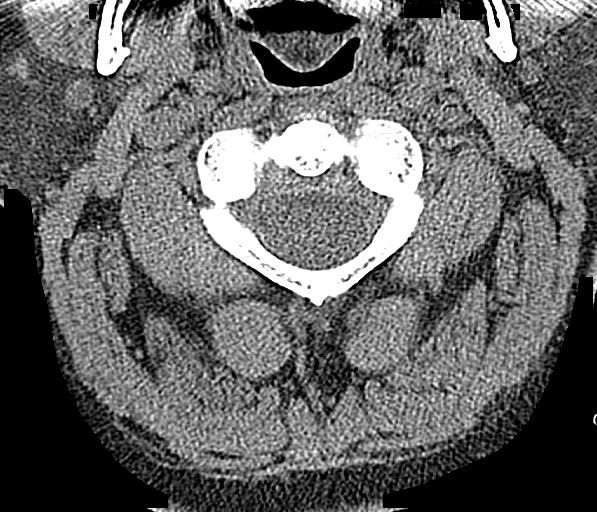
[im 95/114  bone]
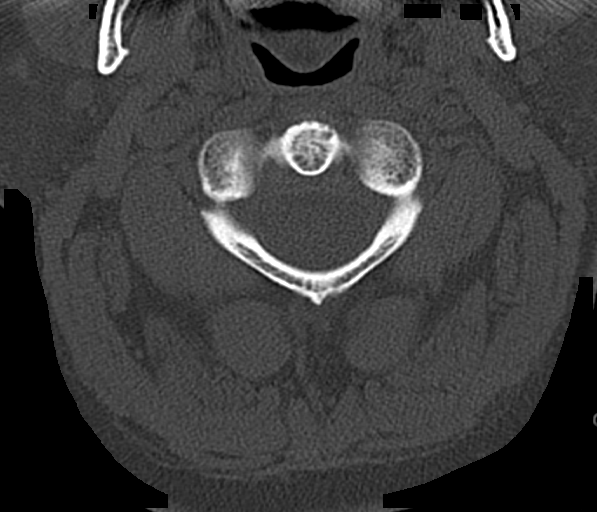

[11 of 33 positions shown; findings below may reference images not displayed]

FINDINGS: CT HEAD FINDINGS

BRAIN: No intraparenchymal hemorrhage, mass effect nor midline
shift. The ventricles and sulci are normal for age. Patchy
supratentorial white matter hypodensities less than expected for
patient's age, though non-specific are most compatible with chronic
small vessel ischemic disease. No acute large vascular territory
infarcts. No abnormal extra-axial fluid collections. Basal cisterns
are patent.

VASCULAR: Mild to moderate calcific atherosclerosis of the carotid
siphons.

SKULL: No skull fracture. No significant scalp soft tissue swelling.

SINUSES/ORBITS: Trace paranasal sinus mucosal thickening. Mastoid
air cells are well aerated.The included ocular globes and orbital
contents are non-suspicious. LEFT foot bilateral ocular lens
implants.

OTHER: None.

CT CERVICAL SPINE FINDINGS

ALIGNMENT: Broad reversed lordosis. Vertebral bodies in alignment.

SKULL BASE AND VERTEBRAE: Cervical vertebral bodies and posterior
elements are intact. C5-6 auto interbody arthrodesis. Severe C6-7
disc height loss, endplate sclerosis and marginal spurring unchanged
from prior CT. Severe upper cervical facet arthropathy. C1-2
articulation maintained with mild arthropathy. No destructive bony
lesions.

SOFT TISSUES AND SPINAL CANAL: Nonacute. Bilateral carotid artery
stents.

DISC LEVELS: No significant osseous canal stenosis. Severe RIGHT
C3-4 neural foraminal narrowing.

UPPER CHEST: Bronchial wall thickening and patchy airspace opacities
RIGHT lung apex . Patulous esophagus.

OTHER: None.
IMPRESSION: CT HEAD: No acute intracranial process; negative CT HEAD for age.

CT CERVICAL SPINE: No acute fracture or malalignment.

Stable examination including C5-6 arthrodesis and C6-7 adjacent
segment disease.

RIGHT apical bronchial wall thickening and patchy airspace
opacities, recommend chest radiograph.
